# Patient Record
Sex: Female | Born: 1965 | Race: Black or African American | Hispanic: No | Marital: Single | Smoking: Never smoker
Health system: Southern US, Community
[De-identification: ages and names within clinical notes are randomized; demographics above are authoritative.]

## PROBLEM LIST (undated history)

## (undated) DIAGNOSIS — M549 Dorsalgia, unspecified: Secondary | ICD-10-CM

## (undated) DIAGNOSIS — M797 Fibromyalgia: Secondary | ICD-10-CM

## (undated) DIAGNOSIS — D869 Sarcoidosis, unspecified: Secondary | ICD-10-CM

## (undated) HISTORY — PX: APPENDECTOMY: SHX54

## (undated) HISTORY — PX: CHOLECYSTECTOMY: SHX55

## (undated) HISTORY — PX: ABDOMINAL HYSTERECTOMY: SHX81

---

## 2001-02-07 ENCOUNTER — Encounter: Payer: Self-pay | Admitting: Emergency Medicine

## 2001-02-07 ENCOUNTER — Emergency Department (HOSPITAL_COMMUNITY): Admission: EM | Admit: 2001-02-07 | Discharge: 2001-02-08 | Payer: Self-pay | Admitting: Emergency Medicine

## 2001-09-18 ENCOUNTER — Encounter: Payer: Self-pay | Admitting: Emergency Medicine

## 2001-09-19 ENCOUNTER — Encounter: Payer: Self-pay | Admitting: *Deleted

## 2001-09-19 ENCOUNTER — Encounter: Payer: Self-pay | Admitting: Emergency Medicine

## 2001-09-19 ENCOUNTER — Inpatient Hospital Stay (HOSPITAL_COMMUNITY): Admission: EM | Admit: 2001-09-19 | Discharge: 2001-09-23 | Payer: Self-pay | Admitting: Family Medicine

## 2001-09-22 ENCOUNTER — Encounter: Payer: Self-pay | Admitting: Internal Medicine

## 2002-03-12 ENCOUNTER — Encounter: Payer: Self-pay | Admitting: Emergency Medicine

## 2002-03-12 ENCOUNTER — Emergency Department (HOSPITAL_COMMUNITY): Admission: EM | Admit: 2002-03-12 | Discharge: 2002-03-12 | Payer: Self-pay | Admitting: Emergency Medicine

## 2007-03-22 ENCOUNTER — Emergency Department (HOSPITAL_COMMUNITY): Admission: EM | Admit: 2007-03-22 | Discharge: 2007-03-23 | Payer: Self-pay | Admitting: Emergency Medicine

## 2008-07-12 ENCOUNTER — Emergency Department (HOSPITAL_COMMUNITY): Admission: EM | Admit: 2008-07-12 | Discharge: 2008-07-12 | Payer: Self-pay | Admitting: Emergency Medicine

## 2008-08-13 ENCOUNTER — Emergency Department (HOSPITAL_COMMUNITY): Admission: EM | Admit: 2008-08-13 | Discharge: 2008-08-13 | Payer: Self-pay | Admitting: Emergency Medicine

## 2009-02-18 ENCOUNTER — Encounter: Admission: RE | Admit: 2009-02-18 | Discharge: 2009-02-18 | Payer: Self-pay | Admitting: Occupational Medicine

## 2009-04-28 ENCOUNTER — Emergency Department (HOSPITAL_COMMUNITY): Admission: EM | Admit: 2009-04-28 | Discharge: 2009-04-28 | Payer: Self-pay | Admitting: Emergency Medicine

## 2009-06-01 ENCOUNTER — Emergency Department (HOSPITAL_COMMUNITY): Admission: EM | Admit: 2009-06-01 | Discharge: 2009-06-01 | Payer: Self-pay | Admitting: Emergency Medicine

## 2009-08-25 ENCOUNTER — Emergency Department (HOSPITAL_COMMUNITY): Admission: EM | Admit: 2009-08-25 | Discharge: 2009-08-25 | Payer: Self-pay | Admitting: Emergency Medicine

## 2010-02-13 ENCOUNTER — Emergency Department (HOSPITAL_COMMUNITY): Admission: EM | Admit: 2010-02-13 | Discharge: 2010-02-14 | Payer: Self-pay | Admitting: Emergency Medicine

## 2010-03-07 ENCOUNTER — Emergency Department (HOSPITAL_COMMUNITY): Admission: EM | Admit: 2010-03-07 | Discharge: 2010-03-07 | Payer: Self-pay | Admitting: Emergency Medicine

## 2011-01-06 ENCOUNTER — Emergency Department (HOSPITAL_BASED_OUTPATIENT_CLINIC_OR_DEPARTMENT_OTHER)
Admission: EM | Admit: 2011-01-06 | Discharge: 2011-01-06 | Disposition: A | Discharge status: Home / Self Care | Payer: Self-pay | Attending: Emergency Medicine | Admitting: Emergency Medicine

## 2011-01-06 DIAGNOSIS — E876 Hypokalemia: Secondary | ICD-10-CM | POA: Insufficient documentation

## 2011-01-06 DIAGNOSIS — R209 Unspecified disturbances of skin sensation: Secondary | ICD-10-CM | POA: Insufficient documentation

## 2011-01-06 LAB — BASIC METABOLIC PANEL
CO2: 26 mEq/L (ref 19–32)
Calcium: 9.3 mg/dL (ref 8.4–10.5)
Chloride: 108 mEq/L (ref 96–112)
Creatinine, Ser: 0.9 mg/dL (ref 0.4–1.2)
Glucose, Bld: 113 mg/dL — ABNORMAL HIGH (ref 70–99)

## 2011-02-16 LAB — URINE CULTURE

## 2011-02-16 LAB — URINALYSIS, ROUTINE W REFLEX MICROSCOPIC
Bilirubin Urine: NEGATIVE
Specific Gravity, Urine: 1.009 (ref 1.005–1.030)
Urobilinogen, UA: 0.2 mg/dL (ref 0.0–1.0)

## 2011-02-16 LAB — PREGNANCY, URINE: Preg Test, Ur: NEGATIVE

## 2011-02-21 LAB — DIFFERENTIAL
Basophils Absolute: 0 10*3/uL (ref 0.0–0.1)
Basophils Relative: 0 % (ref 0–1)
Eosinophils Absolute: 0.1 10*3/uL (ref 0.0–0.7)
Eosinophils Relative: 1 % (ref 0–5)
Lymphocytes Relative: 16 % (ref 12–46)
Lymphs Abs: 1.6 10*3/uL (ref 0.7–4.0)
Monocytes Absolute: 0.5 10*3/uL (ref 0.1–1.0)
Monocytes Relative: 5 % (ref 3–12)
Neutro Abs: 7.9 10*3/uL — ABNORMAL HIGH (ref 1.7–7.7)
Neutrophils Relative %: 78 % — ABNORMAL HIGH (ref 43–77)

## 2011-02-21 LAB — URINALYSIS, ROUTINE W REFLEX MICROSCOPIC
Bilirubin Urine: NEGATIVE
Glucose, UA: NEGATIVE mg/dL
Leukocytes, UA: NEGATIVE
Protein, ur: NEGATIVE mg/dL
Specific Gravity, Urine: 1.022 (ref 1.005–1.030)
pH: 6 (ref 5.0–8.0)

## 2011-02-21 LAB — CBC
HCT: 41.2 % (ref 36.0–46.0)
Hemoglobin: 13.8 g/dL (ref 12.0–15.0)
MCHC: 33.6 g/dL (ref 30.0–36.0)
MCV: 90.7 fL (ref 78.0–100.0)
Platelets: 348 10*3/uL (ref 150–400)
RBC: 4.55 MIL/uL (ref 3.87–5.11)
RDW: 12.6 % (ref 11.5–15.5)
WBC: 10.2 10*3/uL (ref 4.0–10.5)

## 2011-02-21 LAB — POCT I-STAT, CHEM 8: Sodium: 140 mEq/L (ref 135–145)

## 2011-02-21 LAB — URINE MICROSCOPIC-ADD ON

## 2011-03-04 LAB — URINALYSIS, ROUTINE W REFLEX MICROSCOPIC
Bilirubin Urine: NEGATIVE
Glucose, UA: NEGATIVE mg/dL
Ketones, ur: NEGATIVE mg/dL
Specific Gravity, Urine: 1.02 (ref 1.005–1.030)
Urobilinogen, UA: 0.2 mg/dL (ref 0.0–1.0)

## 2011-03-04 LAB — URINE CULTURE

## 2011-03-04 LAB — URINE MICROSCOPIC-ADD ON

## 2011-03-06 LAB — URINALYSIS, ROUTINE W REFLEX MICROSCOPIC
Hgb urine dipstick: NEGATIVE
Ketones, ur: NEGATIVE mg/dL
Protein, ur: NEGATIVE mg/dL
pH: 6 (ref 5.0–8.0)

## 2011-03-07 LAB — POCT CARDIAC MARKERS
CKMB, poc: <1 ng/mL — ABNORMAL LOW (ref 1.0–8.0)
Troponin i, poc: <0.05 ng/mL (ref 0.00–0.09)

## 2011-03-07 LAB — HEPATIC FUNCTION PANEL
Bilirubin, Direct: 0.2 mg/dL (ref 0.0–0.3)
Total Protein: 7 g/dL (ref 6.0–8.3)

## 2011-03-07 LAB — POCT I-STAT, CHEM 8
Calcium, Ion: 1.12 mmol/L (ref 1.12–1.32)
Creatinine, Ser: 1.1 mg/dL (ref 0.4–1.2)
Hemoglobin: 13.3 g/dL (ref 12.0–15.0)
Potassium: 3.8 mEq/L (ref 3.5–5.1)
TCO2: 22 mmol/L (ref 0–100)

## 2011-03-28 ENCOUNTER — Emergency Department (HOSPITAL_BASED_OUTPATIENT_CLINIC_OR_DEPARTMENT_OTHER)
Admission: EM | Admit: 2011-03-28 | Discharge: 2011-03-28 | Disposition: A | Discharge status: Home / Self Care | Payer: BC Managed Care – PPO | Attending: Emergency Medicine | Admitting: Emergency Medicine

## 2011-03-28 DIAGNOSIS — G8929 Other chronic pain: Secondary | ICD-10-CM | POA: Insufficient documentation

## 2011-03-28 DIAGNOSIS — I10 Essential (primary) hypertension: Secondary | ICD-10-CM | POA: Insufficient documentation

## 2011-04-15 NOTE — Discharge Summary (Signed)
[facility]  Patient:    [name], Lauren Choi Visit Number: [id number] MRN: [id number]          Service Type: MED Location: 3W 0351 01 Attending Physician:  Olene Craven Dictated by:   Kern Reap, M.D. Admit Date:  09/18/2001 Discharge Date: 09/23/2001                             Discharge Summary  DISCHARGE DIAGNOSES: 1. Pyelonephritis. 2. Escherichia coli. 3. Anemia. 4. Fever. 5. Hypokalemia.  DISCHARGE MEDICATIONS: 1. Tequin 400 mg p.o. q.d. x 10 days. 2. Ibuprofen 800 mg t.i.d. x 1 week. 3. Percocet 5/325 one or two tabs p.o. q.6h. #30.  PROCEDURES:  None.  CONSULTATIONS:  None.  HOSPITAL COURSE:  The patient was admitted on 09/19/01, after a several day history of dysuria, frequency, suprapubic tenderness with a temperature up to 103 degrees, complaining of severe lower abdominal and back pain.  She was admitted.  CAT scan of the abdomen, ultrasound of the pelvis, and vaginal ultrasound were unremarkable.  She did have urine consistent with an urinary tract infection, and she was admitted for pyelonephritis.  Blood cultures were negative.  Urine culture was positive for E. coli, suseptible to fluoroquinolone.  She defervesced over the course of her hospitalization.  Her abdominal pain was slow to resolve secondary to this.  A repeat CT scan was performed to rule out a perinephric abscess.  CT was negative except for slight inflammation, likely cause of her continued back pain.  She was discharged on 10/27, in stable condition, afebrile, with improving pain.  She was to finish a full 14 day course of Tequin.  She was to follow up as an outpatient for outpatient workup of anemia with Dr. Barbee Shropshire in one to two weeks.  CONDITION ON DISCHARGE:  Stable.Dictated by:   Kern Reap, M.D.  Attending Physician:  Olene Craven DD:  10/17/01 TD:  10/17/01 Job: [id number]

## 2011-04-15 NOTE — H&P (Signed)
[facility]  Patient:    Lauren Choi, Lauren Choi Visit Number: [id number] MRN: [id number]          Service Type: MED Location: 3W 0351 01 Attending Physician:  Olene Craven Dictated by:   Kern Reap, M.D. Admit Date:  09/18/2001                           History and Physical  CHIEF COMPLAINT:  Fever, abdominal pain.  HISTORY OF PRESENT ILLNESS:  [age]-year-old female started her menstrual period on Friday, September 16, 2001.  Over the weekend developed suprapubic tenderness, dysuria, frequency, which further progressed to abdominal pain, back pain, and fevers, the highest 103 as of Tuesday, September 18, 2001.  She came to the hospital for further evaluation.  REVIEW OF SYSTEMS:  Positive for fevers and chills.  Positive for lower abdominal pain, low back pain, dysuria, frequency, hematuria.  Negative for chest pain, negative for shortness of breath.  No rashes noted.  No neurological changes.  No melena.  No musculoskeletal complaints.  Ten-plus symptoms reviewed.  See ER sheet.  PAST MEDICAL HISTORY:  Denies.  PAST SURGICAL HISTORY:  Tubal ligation in 1990, with scar tissue removal in 1999.  SOCIAL HISTORY:  Single, three children.  Nonsmoker, nondrinker.  Is an Higher education careers adviser.  FAMILY HISTORY:  Noncontributory.  ALLERGIES:  CODEINE causes itch.  MEDICATIONS:  She currently takes no medications.  PHYSICAL EXAMINATION:  VITAL SIGNS:  Temperature 103.4, blood pressure 154/98, pulse 154, temperature respiratory rate 22.  GENERAL:  Awake and oriented female in mild distress.  HEENT:  Pupils equal and reactive to light.  Extraocular muscles intact.  NECK:  Supple.  No JVD, no bruit, no thyromegaly, no lymphadenopathy. Tympanic membranes within normal.  Fundus within normal.  LUNGS:  Clear to auscultation.  HEART:  Tachycardic, S1, S2.  Without murmur, gallop, or rub.  ABDOMEN:  Soft, nondistended.  Positive bowel sounds.   Left lower quadrant and right lower quadrant abdominal discomfort.  No guarding, no rigidity. Suprapubic tenderness.  Positive CVA tenderness.  PELVIC:  Per EDP.  Cultures were sent.  EXTREMITIES:  No clubbing or cyanosis.  NEUROLOGIC:  Nonfocal.  SKIN:  Cool, dry, no rashes noted.  LABORATORY DATA:  Chest x-ray was negative.  Abdominal film:  No free air. Showed thick-looped bowel.  CT of the was no significant abnormalities, a 4 cm left renal cyst was noted.  CT of the pelvis showed a 1.5 cm right ovarian cyst, no free air, no other abnormalities.  Ultrasound of the pelvis was no significant abnormalities.  Transvaginal ultrasound showed a small right ovarian cyst, no free fluid, no free air.  Urine pregnancy was negative.  WBC 10.5, hemoglobin 11.8, platelets 337, neutrophils 84%.  Sodium 137, potassium 3.2, chloride 107, bicarbonate 22, BUN 11, creatinine 1.2, glucose 127, total bilirubin ______ , albumin 3.9, AST 20, ALT 20, alkaline phosphatase 66.  UA was positive for blood, nitrate, leukocyte esterase, and 7-10 wbcs.  IMPRESSION AND PLAN:  Ms. Rathod presents with probable pyelonephritis, rule out sepsis.  Started with dysuria and frequency and progressed to abdominal pain, back pain, and fevers.  She has had extensive workup in the emergency room including CT of the abdomen, pelvis, and ultrasound of the abdomen and then pelvis, with only significant findings of right ovarian cyst, probably incidental.  She has received a pelvic examination per the EDP, and cultures have been sent off.  At  this time the significant findings are urine and likely pyelonephritis.  Will admit the patient for further IV hydration, IV Tequin, blood cultures, and urine cultures.  We will monitor abdominal pain. Will also administer some pain control.  She does not appear to have a surgical abdomen at this time.  Dictated by:   Kern Reap, M.D.  Attending Physician:  Olene Craven DD:   09/19/01 TD:  09/20/01 Job: [id number]

## 2011-08-29 LAB — URINE CULTURE: Colony Count: >=100000

## 2011-08-29 LAB — URINALYSIS, ROUTINE W REFLEX MICROSCOPIC
Bilirubin Urine: NEGATIVE
Nitrite: NEGATIVE
Specific Gravity, Urine: 1.015
Urobilinogen, UA: 0.2

## 2011-08-29 LAB — URINE MICROSCOPIC-ADD ON

## 2013-06-06 ENCOUNTER — Emergency Department (HOSPITAL_COMMUNITY): Payer: Self-pay

## 2013-06-06 ENCOUNTER — Encounter (HOSPITAL_COMMUNITY): Payer: Self-pay | Admitting: Emergency Medicine

## 2013-06-06 ENCOUNTER — Emergency Department (HOSPITAL_COMMUNITY)
Admission: EM | Admit: 2013-06-06 | Discharge: 2013-06-06 | Disposition: A | Discharge status: Home / Self Care | Payer: Self-pay | Attending: Emergency Medicine | Admitting: Emergency Medicine

## 2013-06-06 DIAGNOSIS — Z791 Long term (current) use of non-steroidal anti-inflammatories (NSAID): Secondary | ICD-10-CM | POA: Insufficient documentation

## 2013-06-06 DIAGNOSIS — R079 Chest pain, unspecified: Secondary | ICD-10-CM

## 2013-06-06 DIAGNOSIS — R072 Precordial pain: Secondary | ICD-10-CM | POA: Insufficient documentation

## 2013-06-06 HISTORY — DX: Dorsalgia, unspecified: M54.9

## 2013-06-06 LAB — CBC
HCT: 40.7 % (ref 36.0–46.0)
Hemoglobin: 13.8 g/dL (ref 12.0–15.0)
MCV: 85 fL (ref 78.0–100.0)
Platelets: 326 10*3/uL (ref 150–400)
RBC: 4.79 MIL/uL (ref 3.87–5.11)
WBC: 7.8 10*3/uL (ref 4.0–10.5)

## 2013-06-06 LAB — BASIC METABOLIC PANEL
Calcium: 10 mg/dL (ref 8.4–10.5)
GFR calc Af Amer: 81 mL/min — ABNORMAL LOW (ref >90)
GFR calc non Af Amer: 70 mL/min — ABNORMAL LOW (ref >90)

## 2013-06-06 LAB — POCT I-STAT TROPONIN I: Troponin i, poc: 0.01 ng/mL (ref 0.00–0.08)

## 2013-06-06 MED ORDER — HYDROMORPHONE HCL PF 1 MG/ML IJ SOLN
1.0000 mg | Freq: Once | INTRAMUSCULAR | Status: AC
  Administered 2013-06-06: 1 mg via INTRAVENOUS
  Filled 2013-06-06: qty 1

## 2013-06-06 MED ORDER — IOHEXOL 350 MG/ML SOLN
100.0000 mL | Freq: Once | INTRAVENOUS | Status: AC | PRN
  Administered 2013-06-06: 100 mL via INTRAVENOUS

## 2013-06-06 MED ORDER — PREDNISONE 20 MG PO TABS
60.0000 mg | ORAL_TABLET | ORAL | Status: AC
  Administered 2013-06-06: 60 mg via ORAL
  Filled 2013-06-06: qty 3

## 2013-06-06 MED ORDER — MORPHINE SULFATE 4 MG/ML IJ SOLN: 8.0000 mg | Freq: Once | INTRAMUSCULAR | Status: DC

## 2013-06-06 MED ORDER — MORPHINE SULFATE 4 MG/ML IJ SOLN
8.0000 mg | Freq: Once | INTRAMUSCULAR | Status: AC
  Administered 2013-06-06: 8 mg via INTRAVENOUS
  Filled 2013-06-06: qty 2

## 2013-06-06 MED ORDER — ALBUTEROL SULFATE (5 MG/ML) 0.5% IN NEBU
5.0000 mg | INHALATION_SOLUTION | Freq: Once | RESPIRATORY_TRACT | Status: AC
  Administered 2013-06-06: 5 mg via RESPIRATORY_TRACT
  Filled 2013-06-06: qty 1

## 2013-06-06 MED ORDER — GI COCKTAIL ~~LOC~~
30.0000 mL | Freq: Once | ORAL | Status: AC
  Administered 2013-06-06: 30 mL via ORAL
  Filled 2013-06-06: qty 30

## 2013-06-06 NOTE — ED Notes (Signed)
Pt states chest pain started at 10 today and has  Been constant ever since,  Nausea and radiation,  Shortness of breath  dizziness

## 2013-06-06 NOTE — ED Provider Notes (Signed)
History    CSN: [id number] Arrival date & time 06/06/13  1919  First MD Initiated Contact with Patient 06/06/13 1926     Chief Complaint  Patient presents with  . Chest Pain   (Consider location/radiation/quality/duration/timing/severity/associated sxs/prior Treatment) HPI Patient presents with chest pain. The pain is substernal,  With mild diffuse radiation., sore. There is associated nausea. There is associated dyspnea, and dizziness without syncope or vomiting or diarrhea. Patient notes that she was in her usual state of health prior to the onset of symptoms. She denies fevers, chills, cough. No relief with anything, no clear exacerbating   Past Medical History  Diagnosis Date  . Back pain    Past Surgical History  Procedure Laterality Date  . Cholecystectomy    . Abdominal hysterectomy    . Appendectomy     No family history on file. History  Substance Use Topics  . Smoking status: Never Smoker   . Smokeless tobacco: Not on file  . Alcohol Use: No   OB History   Grav Para Term Preterm Abortions TAB SAB Ect Mult Living                 Review of Systems  Constitutional:       Per HPI, otherwise negative  HENT:       Per HPI, otherwise negative  Respiratory:       Per HPI, otherwise negative  Cardiovascular:       Per HPI, otherwise negative  Gastrointestinal: Negative for vomiting.  Endocrine:       Negative aside from HPI  Genitourinary:       Neg aside from HPI   Musculoskeletal:       Per HPI, otherwise negative  Skin: Negative.   Neurological: Negative for syncope.    Allergies  Tramadol  Home Medications   Current Outpatient Rx  Name  Route  Sig  Dispense  Refill  . cyclobenzaprine (FLEXERIL) 10 MG tablet   Oral   Take 10 mg by mouth 3 (three) times daily as needed for muscle spasms.         . meloxicam (MOBIC) 15 MG tablet   Oral   Take 15 mg by mouth daily.         Marland Kitchen morphine (MSIR) 15 MG tablet   Oral   Take 22.5 mg by  mouth every 4 (four) hours as needed for pain.          BP 108/79  Pulse 119  Temp(Src) 98.6 F (37 C) (Oral)  Resp 18  SpO2 99% Physical Exam  Nursing note and vitals reviewed. Constitutional: She is oriented to person, place, and time. She appears well-developed and well-nourished. No distress.  HENT:  Head: Normocephalic and atraumatic.  Eyes: Conjunctivae and EOM are normal.  Cardiovascular: Regular rhythm.  Tachycardia present.   Pulmonary/Chest: Breath sounds normal. No stridor. Tachypnea noted. No respiratory distress.  Abdominal: She exhibits no distension.  Musculoskeletal: She exhibits no edema.  Neurological: She is alert and oriented to person, place, and time. No cranial nerve deficit.  Skin: Skin is warm and dry.  Psychiatric: She has a normal mood and affect.    ED Course  Procedures (including critical care time) Labs Reviewed  BASIC METABOLIC PANEL - Abnormal; Notable for the following:    Potassium 3.3 (*)    Glucose, Bld 108 (*)    GFR calc non Af Amer 70 (*)    GFR calc Af Amer 81 (*)  All other components within normal limits  CBC  PRO B NATRIURETIC PEPTIDE  POCT I-STAT TROPONIN I   Dg Chest Port 1 View  06/06/2013   *RADIOLOGY REPORT*  Clinical Data: Chest pain.  PORTABLE CHEST - 1 VIEW  Comparison: 03/07/2010  Findings: Heart is upper limits normal in size.  Lungs are clear. No effusions or edema.  No acute bony abnormality.  Mediastinal contours within normal limits.  IMPRESSION: No active cardiopulmonary disease.   Original Report Authenticated By: Charlett Nose, M.D.   No diagnosis found. Cardiac 120 sinus tach abnormal Pulse ox 100% room air normal EKG is 113 sinus tachycardia with nonspecific T wave changes.  It is faster, but otherwise not notably different from prior EKG.  This is an abnormal EKG  9:09 PM Patient mildly better. She now has clear inspiratory sounds - but poor expiratory sounds. She now states that she has  sarcoidosis.  I reviewed the patient's chart from another facility.  She has a prior diagnosis of chronic chest pain, sarcoidosis, CT scan was performed in 1 year ago. MDM  This patient presents with chest pain.  On exam she is awake and alert, but tachycardic, and uncomfortable appearing patient's evaluation was largely reassuring, and skin performed with concern for tachycardia, dyspnea, to rule out PE was reassuring.  The patient was discharged in stable condition to follow up with her primary care team.  Gerhard Munch, MD 06/06/13 2311

## 2013-06-06 NOTE — ED Notes (Signed)
Notified RN, Tobi Bastos heart rate elevated 136 and blood pressure elevated 140/100.

## 2014-03-06 ENCOUNTER — Emergency Department (HOSPITAL_BASED_OUTPATIENT_CLINIC_OR_DEPARTMENT_OTHER)
Admission: EM | Admit: 2014-03-06 | Discharge: 2014-03-07 | Disposition: A | Discharge status: Home / Self Care | Payer: BC Managed Care – PPO | Attending: Emergency Medicine | Admitting: Emergency Medicine

## 2014-03-06 ENCOUNTER — Encounter (HOSPITAL_BASED_OUTPATIENT_CLINIC_OR_DEPARTMENT_OTHER): Payer: Self-pay | Admitting: Emergency Medicine

## 2014-03-06 DIAGNOSIS — G8929 Other chronic pain: Secondary | ICD-10-CM | POA: Insufficient documentation

## 2014-03-06 DIAGNOSIS — Z79899 Other long term (current) drug therapy: Secondary | ICD-10-CM | POA: Insufficient documentation

## 2014-03-06 DIAGNOSIS — Z791 Long term (current) use of non-steroidal anti-inflammatories (NSAID): Secondary | ICD-10-CM | POA: Insufficient documentation

## 2014-03-06 DIAGNOSIS — M545 Low back pain, unspecified: Secondary | ICD-10-CM | POA: Insufficient documentation

## 2014-03-06 DIAGNOSIS — M549 Dorsalgia, unspecified: Secondary | ICD-10-CM

## 2014-03-06 DIAGNOSIS — F411 Generalized anxiety disorder: Secondary | ICD-10-CM | POA: Insufficient documentation

## 2014-03-06 DIAGNOSIS — R52 Pain, unspecified: Secondary | ICD-10-CM | POA: Insufficient documentation

## 2014-03-06 DIAGNOSIS — M6281 Muscle weakness (generalized): Secondary | ICD-10-CM | POA: Insufficient documentation

## 2014-03-06 DIAGNOSIS — R209 Unspecified disturbances of skin sensation: Secondary | ICD-10-CM | POA: Insufficient documentation

## 2014-03-06 DIAGNOSIS — M25559 Pain in unspecified hip: Secondary | ICD-10-CM | POA: Insufficient documentation

## 2014-03-06 NOTE — ED Notes (Signed)
Lower back pain. Hx of bulging disc.

## 2014-03-07 MED ORDER — HYDROMORPHONE HCL PF 2 MG/ML IJ SOLN
INTRAMUSCULAR | Status: AC
  Administered 2014-03-07: 2 mg
  Filled 2014-03-07: qty 1

## 2014-03-07 MED ORDER — MORPHINE SULFATE 4 MG/ML IJ SOLN
6.0000 mg | Freq: Once | INTRAMUSCULAR | Status: DC
  Filled 2014-03-07: qty 2

## 2014-03-07 MED ORDER — HYDROMORPHONE HCL PF 2 MG/ML IJ SOLN
2.0000 mg | Freq: Once | INTRAMUSCULAR | Status: AC
Start: 2014-03-07 — End: 2014-03-07
  Administered 2014-03-07: 2 mg via INTRAVENOUS
  Filled 2014-03-07: qty 1

## 2014-03-07 MED ORDER — MORPHINE SULFATE 4 MG/ML IJ SOLN
4.0000 mg | Freq: Once | INTRAMUSCULAR | Status: AC
  Administered 2014-03-07: 4 mg via INTRAVENOUS

## 2014-03-07 MED ORDER — ONDANSETRON HCL 4 MG/2ML IJ SOLN
4.0000 mg | Freq: Once | INTRAMUSCULAR | Status: AC
  Administered 2014-03-07: 4 mg via INTRAVENOUS
  Filled 2014-03-07: qty 2

## 2014-03-07 NOTE — ED Provider Notes (Signed)
CSN: [id number]     Arrival date & time 03/06/14  2210 History   First MD Initiated Contact with Patient 03/07/14 0104     Chief Complaint  Patient presents with  . Back Pain     (Consider location/radiation/quality/duration/timing/severity/associated sxs/prior Treatment) HPI This is a 48 year old female with a history of chronic back pain on chronic Nucynta 75 mg tablets managed by [facility]. Her most recent prescription was for 150 tablets filled on April 6. She is here with an acute exacerbation of her pain that began 3 days ago. The pain is located across her lower back and radiates down her right leg. Her pain is severe and not controlled with her scheduled narcotics. It is very difficult for her to ambulate due to the pain and she has to hold onto objects to keep from falling. She states her right leg is weaker than usual with increased paresthesias. She denies bowel or bladder changes. She has not contacted her pain management clinic. She states she has had to visit various emergency departments in the area when she has had exacerbations of her pain. She acknowledges that her pain is never completely controlled in an ED setting.  Past Medical History  Diagnosis Date  . Back pain    Past Surgical History  Procedure Laterality Date  . Cholecystectomy    . Abdominal hysterectomy    . Appendectomy     No family history on file. History  Substance Use Topics  . Smoking status: Never Smoker   . Smokeless tobacco: Not on file  . Alcohol Use: No   OB History   Grav Para Term Preterm Abortions TAB SAB Ect Mult Living                 Review of Systems  All other systems reviewed and are negative.  Allergies  Tramadol  Home Medications   Current Outpatient Rx  Name  Route  Sig  Dispense  Refill  . diazepam (VALIUM) 5 MG tablet   Oral   Take 5 mg by mouth every 6 (six) hours as needed for anxiety.         . sertraline (ZOLOFT) 50 MG tablet   Oral   Take  50 mg by mouth daily.         . tapentadol (NUCYNTA) 50 MG TABS tablet   Oral   Take 75 mg by mouth.         . cyclobenzaprine (FLEXERIL) 10 MG tablet   Oral   Take 10 mg by mouth 3 (three) times daily as needed for muscle spasms.         . meloxicam (MOBIC) 15 MG tablet   Oral   Take 15 mg by mouth daily.         Marland Kitchen morphine (MSIR) 15 MG tablet   Oral   Take 22.5 mg by mouth every 4 (four) hours as needed for pain.          BP 114/78  Pulse 93  Temp(Src) 98.2 F (36.8 C) (Oral)  Resp 20  Ht 5\' 7"  (1.702 m)  Wt 189 lb (85.73 kg)  BMI 29.59 kg/m2  SpO2 99%  Physical Exam General: Well-developed, well-nourished female in no acute distress; appearance consistent with age of record; appears uncomfortable HENT: normocephalic; atraumatic Eyes: pupils equal, round and reactive to light; extraocular muscles intact Neck: supple Heart: regular rate and rhythm Lungs: clear to auscultation bilaterally Abdomen: soft; nondistended; nontender; no masses or hepatosplenomegaly;  bowel sounds present GU: no saddle anesthesia Back: Tenderness across lower back; severe pain on movement of lower back or legs at the hip Extremities: No deformity; decreased range of motion at the hip Neurologic: Awake, alert and oriented; motor function +5 in upper extremities, + left lower extremity, +4 in right lower extremity; decreased sensation right lower extremity; no facial droop Skin: Warm and dry Psychiatric: Anxious    ED Course  Procedures (including critical care time)   MDM  5:47 AM Patient states her pain is down to 7/10. The narcotics have made her groggy and she was advised that we do not feel that it is safe to give her any additional narcotics. She declined Toradol. She was advised that she needs to contact her pain management clinic this morning this emergency departments or not an appropriate long-term solution to chronic pain.     Hanley SeamenJohn L Donnisha Besecker, MD 03/07/14 [id number]

## 2014-08-01 ENCOUNTER — Encounter (HOSPITAL_COMMUNITY): Payer: Self-pay | Admitting: Emergency Medicine

## 2014-08-01 ENCOUNTER — Emergency Department (HOSPITAL_COMMUNITY)
Admission: EM | Admit: 2014-08-01 | Discharge: 2014-08-02 | Disposition: A | Discharge status: Home / Self Care | Payer: Self-pay | Attending: Emergency Medicine | Admitting: Emergency Medicine

## 2014-08-01 ENCOUNTER — Emergency Department (HOSPITAL_COMMUNITY): Payer: Self-pay

## 2014-08-01 DIAGNOSIS — M545 Low back pain, unspecified: Secondary | ICD-10-CM | POA: Insufficient documentation

## 2014-08-01 DIAGNOSIS — Z791 Long term (current) use of non-steroidal anti-inflammatories (NSAID): Secondary | ICD-10-CM | POA: Insufficient documentation

## 2014-08-01 DIAGNOSIS — R339 Retention of urine, unspecified: Secondary | ICD-10-CM | POA: Insufficient documentation

## 2014-08-01 DIAGNOSIS — Z79899 Other long term (current) drug therapy: Secondary | ICD-10-CM | POA: Insufficient documentation

## 2014-08-01 MED ORDER — HYDROMORPHONE HCL PF 1 MG/ML IJ SOLN
1.0000 mg | Freq: Once | INTRAMUSCULAR | Status: AC
  Administered 2014-08-01: 1 mg via INTRAMUSCULAR
  Filled 2014-08-01: qty 1

## 2014-08-01 MED ORDER — ONDANSETRON 4 MG PO TBDP
4.0000 mg | ORAL_TABLET | Freq: Once | ORAL | Status: AC
  Administered 2014-08-01: 4 mg via ORAL
  Filled 2014-08-01: qty 1

## 2014-08-01 NOTE — ED Notes (Signed)
Report from [place], [place]

## 2014-08-01 NOTE — ED Notes (Signed)
Pt states she cannot void; MD made aware

## 2014-08-01 NOTE — ED Provider Notes (Signed)
CSN: [id number]     Arrival date & time 08/01/14  1805 History   First MD Initiated Contact with Patient 08/01/14 2138     Chief Complaint  Patient presents with  . Back Pain  . Urinary Retention     (Consider location/radiation/quality/duration/timing/severity/associated sxs/prior Treatment) HPI Comments: Patient reporting inability to urinate since around 1100 today, even though she feels the urge to do so.  Patient is a 48 y.o. female presenting with back pain and dysuria. The history is provided by the patient.  Back Pain Location:  Lumbar spine Quality:  Aching Radiates to: groin. Pain severity:  Moderate Pain is:  Same all the time Timing:  Intermittent Progression:  Worsening Chronicity:  Chronic Associated symptoms: dysuria   Associated symptoms: no abdominal pain   Dysuria Pain severity:  Mild Duration:  1 day Chronicity:  New Recent urinary tract infections: no   Associated symptoms: no abdominal pain, no flank pain, no nausea and no vomiting     Past Medical History  Diagnosis Date  . Back pain    Past Surgical History  Procedure Laterality Date  . Cholecystectomy    . Abdominal hysterectomy    . Appendectomy     History reviewed. No pertinent family history. History  Substance Use Topics  . Smoking status: Never Smoker   . Smokeless tobacco: Not on file  . Alcohol Use: No   OB History   Grav Para Term Preterm Abortions TAB SAB Ect Mult Living                 Review of Systems  Gastrointestinal: Negative for nausea, vomiting, abdominal pain and abdominal distention.  Genitourinary: Positive for dysuria and difficulty urinating. Negative for flank pain.  Musculoskeletal: Positive for back pain.  All other systems reviewed and are negative.     Allergies  Tramadol  Home Medications   Prior to Admission medications   Medication Sig Start Date End Date Taking? Authorizing Provider  cyclobenzaprine (FLEXERIL) 10 MG tablet Take 10 mg by mouth  3 (three) times daily as needed for muscle spasms.    Historical Provider, MD  diazepam (VALIUM) 5 MG tablet Take 5 mg by mouth every 6 (six) hours as needed for anxiety.    Historical Provider, MD  meloxicam (MOBIC) 15 MG tablet Take 15 mg by mouth daily.    Historical Provider, MD  morphine (MSIR) 15 MG tablet Take 22.5 mg by mouth every 4 (four) hours as needed for pain.    Historical Provider, MD  sertraline (ZOLOFT) 50 MG tablet Take 50 mg by mouth daily.    Historical Provider, MD  tapentadol (NUCYNTA) 50 MG TABS tablet Take 75 mg by mouth.    Historical Provider, MD   BP 112/72  Pulse 105  Temp(Src) 98.6 F (37 C) (Oral)  Resp 18  SpO2 98% Physical Exam  Nursing note and vitals reviewed. Constitutional: She is oriented to person, place, and time. She appears well-developed and well-nourished.  HENT:  Head: Normocephalic and atraumatic.  Eyes: Pupils are equal, round, and reactive to light.  Neck: Normal range of motion.  Cardiovascular: Normal rate and regular rhythm.   Pulmonary/Chest: Breath sounds normal.  Abdominal: Soft. Bowel sounds are normal.  Musculoskeletal: Normal range of motion.  Lymphadenopathy:    She has no cervical adenopathy.  Neurological: She is alert and oriented to person, place, and time. No cranial nerve deficit. Coordination normal.  Skin: Skin is warm and dry.    ED Course  Procedures (including critical care time) Labs Review Labs Reviewed - No data to display  Imaging Review No results found. Bladder scan reveals ~350 cc urine.  Patient unable to void.  MOE x 4, no strength deficits noted. No problem with ambulation. Discussed with Dr. Sherlyn Lees obtain MRI.  MRI results reviewed and shared with patient.   MDM   Final diagnoses:  None    Urinary retention.  MRI not suggestive of neurologic etiology.  No UTI. Foley catheter placed, will have patient follow-up with urology.    Jimmye Norman, NP 08/02/14 [phone]

## 2014-08-01 NOTE — ED Notes (Signed)
Pt presents with chronic back pain x1-2 years, pt reports numbness to BUE and BLE x2 days, pt is ambulatory to triage, performing all ADL's. Pt states she is unable to "urinate" all day

## 2014-08-02 LAB — CBC WITH DIFFERENTIAL/PLATELET
BASOS PCT: 0 % (ref 0–1)
Basophils Absolute: 0 10*3/uL (ref 0.0–0.1)
EOS ABS: 0.2 10*3/uL (ref 0.0–0.7)
Eosinophils Relative: 3 % (ref 0–5)
HCT: 37.9 % (ref 36.0–46.0)
Hemoglobin: 12.5 g/dL (ref 12.0–15.0)
LYMPHS PCT: 40 % (ref 12–46)
Lymphs Abs: 2.4 10*3/uL (ref 0.7–4.0)
MCH: 28.9 pg (ref 26.0–34.0)
MCHC: 33 g/dL (ref 30.0–36.0)
MCV: 87.5 fL (ref 78.0–100.0)
Monocytes Absolute: 0.3 10*3/uL (ref 0.1–1.0)
Monocytes Relative: 5 % (ref 3–12)
NEUTROS ABS: 3 10*3/uL (ref 1.7–7.7)
Neutrophils Relative %: 52 % (ref 43–77)
Platelets: 254 10*3/uL (ref 150–400)
RBC: 4.33 MIL/uL (ref 3.87–5.11)
RDW: 12 % (ref 11.5–15.5)
WBC: 5.8 10*3/uL (ref 4.0–10.5)

## 2014-08-02 LAB — URINALYSIS, ROUTINE W REFLEX MICROSCOPIC
Bilirubin Urine: NEGATIVE
GLUCOSE, UA: NEGATIVE mg/dL
Hgb urine dipstick: NEGATIVE
Ketones, ur: NEGATIVE mg/dL
LEUKOCYTES UA: NEGATIVE
Nitrite: NEGATIVE
PROTEIN: NEGATIVE mg/dL
Specific Gravity, Urine: 1.019 (ref 1.005–1.030)
Urobilinogen, UA: 0.2 mg/dL (ref 0.0–1.0)
pH: 5.5 (ref 5.0–8.0)

## 2014-08-02 LAB — BASIC METABOLIC PANEL
Anion gap: 12 (ref 5–15)
BUN: 13 mg/dL (ref 6–23)
CALCIUM: 8.9 mg/dL (ref 8.4–10.5)
CO2: 24 mEq/L (ref 19–32)
Chloride: 105 mEq/L (ref 96–112)
Creatinine, Ser: 1.12 mg/dL — ABNORMAL HIGH (ref 0.50–1.10)
GFR, EST AFRICAN AMERICAN: 66 mL/min — AB (ref >90)
GFR, EST NON AFRICAN AMERICAN: 57 mL/min — AB (ref >90)
Glucose, Bld: 99 mg/dL (ref 70–99)
Potassium: 3.7 mEq/L (ref 3.7–5.3)
SODIUM: 141 meq/L (ref 137–147)

## 2014-08-02 NOTE — ED Notes (Signed)
Pt foley changed to leg bag.  Education provided to pt regarding how to empty leg bag and that foley will stay inserted until she f/u with urology in 4 days.  Pt verbalized understanding of education regarding leg bag.

## 2014-08-02 NOTE — ED Notes (Signed)
Pt given sprite, peanut butter, and crackers.

## 2014-08-02 NOTE — ED Provider Notes (Signed)
Medical screening examination/treatment/procedure(s) were performed by non-physician practitioner and as supervising physician I was immediately available for consultation/collaboration.   EKG Interpretation None        Glorianne Proctor T Joniyah Mallinger, MD 08/02/14 1515 

## 2014-08-02 NOTE — Discharge Instructions (Signed)
Acute Urinary Retention °Acute urinary retention is the temporary inability to urinate. This is an uncommon problem in women. It can be caused by: °· Infection. °· A side effect of a medicine. °· A problem in a nearby organ that presses or squeezes on the bladder or the urethra (the tube that drains the bladder). °· Psychological problems. °·  Surgery on your bladder, urethra, or pelvic organs that causes obstruction to the outflow of urine from your bladder. °HOME CARE INSTRUCTIONS  °If you are sent home with a Foley catheter and a drainage system, you will need to discuss the best course of action with your health care provider. While the catheter is in, maintain a good intake of fluids. Keep the drainage bag emptied and lower than your catheter. This is so that contaminated urine will not flow back into your bladder, which could lead to a urinary tract infection. °There are two main types of drainage bags. One is a large bag that usually is used at night. It has a good capacity that will allow you to sleep through the night without having to empty it. The second type is called a leg bag. It has a smaller capacity so it needs to be emptied more frequently. However, the main advantage is that it can be attached by a leg strap and goes underneath your clothing, allowing you the freedom to move about or leave your home. °Only take over-the-counter or prescription medicines for pain, discomfort, or fever as directed by your health care provider.  °SEEK MEDICAL CARE IF: °· You develop a low-grade fever. °· You experience spasms or leakage of urine with the spasms. °SEEK IMMEDIATE MEDICAL CARE IF:  °· You develop chills or fever. °· Your catheter stops draining urine. °· Your catheter falls out. °· You start to develop increased bleeding that does not respond to rest and increased fluid intake. °MAKE SURE YOU: °· Understand these instructions. °· Will watch your condition. °· Will get help right away if you are not  doing well or get worse. °Document Released: 11/13/2006 Document Revised: 09/04/2013 Document Reviewed: 04/25/2013 °[employer]® Patient Information ©2015 [employer]. This information is not intended to replace advice given to you by your health care provider. Make sure you discuss any questions you have with your health care provider. ° °

## 2015-01-21 ENCOUNTER — Emergency Department (HOSPITAL_COMMUNITY): Payer: Self-pay

## 2015-01-21 ENCOUNTER — Emergency Department (HOSPITAL_COMMUNITY)
Admission: EM | Admit: 2015-01-21 | Discharge: 2015-01-21 | Disposition: A | Discharge status: Home / Self Care | Payer: Self-pay | Attending: Emergency Medicine | Admitting: Emergency Medicine

## 2015-01-21 ENCOUNTER — Encounter (HOSPITAL_COMMUNITY): Payer: Self-pay | Admitting: *Deleted

## 2015-01-21 DIAGNOSIS — R0602 Shortness of breath: Secondary | ICD-10-CM | POA: Insufficient documentation

## 2015-01-21 DIAGNOSIS — Z791 Long term (current) use of non-steroidal anti-inflammatories (NSAID): Secondary | ICD-10-CM | POA: Insufficient documentation

## 2015-01-21 DIAGNOSIS — Z862 Personal history of diseases of the blood and blood-forming organs and certain disorders involving the immune mechanism: Secondary | ICD-10-CM | POA: Insufficient documentation

## 2015-01-21 DIAGNOSIS — Z79899 Other long term (current) drug therapy: Secondary | ICD-10-CM | POA: Insufficient documentation

## 2015-01-21 DIAGNOSIS — M546 Pain in thoracic spine: Secondary | ICD-10-CM | POA: Insufficient documentation

## 2015-01-21 HISTORY — DX: Sarcoidosis, unspecified: D86.9

## 2015-01-21 LAB — CBC WITH DIFFERENTIAL/PLATELET
Basophils Absolute: 0 10*3/uL (ref 0.0–0.1)
Basophils Relative: 0 % (ref 0–1)
Eosinophils Absolute: 0.2 10*3/uL (ref 0.0–0.7)
Eosinophils Relative: 2 % (ref 0–5)
HCT: 41.3 % (ref 36.0–46.0)
HEMOGLOBIN: 14 g/dL (ref 12.0–15.0)
LYMPHS ABS: 2.6 10*3/uL (ref 0.7–4.0)
Lymphocytes Relative: 35 % (ref 12–46)
MCH: 29.4 pg (ref 26.0–34.0)
MCHC: 33.9 g/dL (ref 30.0–36.0)
MCV: 86.6 fL (ref 78.0–100.0)
MONOS PCT: 5 % (ref 3–12)
Monocytes Absolute: 0.4 10*3/uL (ref 0.1–1.0)
NEUTROS ABS: 4.2 10*3/uL (ref 1.7–7.7)
Neutrophils Relative %: 58 % (ref 43–77)
Platelets: 339 10*3/uL (ref 150–400)
RBC: 4.77 MIL/uL (ref 3.87–5.11)
RDW: 12.3 % (ref 11.5–15.5)
WBC: 7.4 10*3/uL (ref 4.0–10.5)

## 2015-01-21 LAB — TROPONIN I: Troponin I: <0.03 ng/mL (ref <0.031)

## 2015-01-21 LAB — BASIC METABOLIC PANEL
Anion gap: 11 (ref 5–15)
BUN: 6 mg/dL (ref 6–23)
CHLORIDE: 106 mmol/L (ref 96–112)
CO2: 22 mmol/L (ref 19–32)
Calcium: 9.6 mg/dL (ref 8.4–10.5)
Creatinine, Ser: 0.95 mg/dL (ref 0.50–1.10)
GFR calc Af Amer: 80 mL/min — ABNORMAL LOW (ref >90)
GFR calc non Af Amer: 69 mL/min — ABNORMAL LOW (ref >90)
Glucose, Bld: 144 mg/dL — ABNORMAL HIGH (ref 70–99)
POTASSIUM: 3.3 mmol/L — AB (ref 3.5–5.1)
SODIUM: 139 mmol/L (ref 135–145)

## 2015-01-21 MED ORDER — ONDANSETRON HCL 4 MG/2ML IJ SOLN
4.0000 mg | Freq: Once | INTRAMUSCULAR | Status: AC
  Administered 2015-01-21: 4 mg via INTRAVENOUS
  Filled 2015-01-21: qty 2

## 2015-01-21 MED ORDER — IOHEXOL 350 MG/ML SOLN
100.0000 mL | Freq: Once | INTRAVENOUS | Status: AC | PRN
Start: 2015-01-21 — End: 2015-01-21
  Administered 2015-01-21: 80 mL via INTRAVENOUS

## 2015-01-21 MED ORDER — METHOCARBAMOL 1000 MG/10ML IJ SOLN
1000.0000 mg | Freq: Once | INTRAVENOUS | Status: AC
  Administered 2015-01-21: 1000 mg via INTRAVENOUS
  Filled 2015-01-21: qty 10

## 2015-01-21 MED ORDER — OXYCODONE-ACETAMINOPHEN 5-325 MG PO TABS: 2.0000 | ORAL_TABLET | ORAL | Status: DC | PRN

## 2015-01-21 MED ORDER — METHOCARBAMOL 500 MG PO TABS: 500.0000 mg | ORAL_TABLET | Freq: Two times a day (BID) | ORAL | Status: DC

## 2015-01-21 MED ORDER — NAPROXEN 500 MG PO TABS: 500.0000 mg | ORAL_TABLET | Freq: Two times a day (BID) | ORAL | Status: DC

## 2015-01-21 MED ORDER — HYDROMORPHONE HCL 1 MG/ML IJ SOLN
1.0000 mg | INTRAMUSCULAR | Status: AC | PRN
  Administered 2015-01-21 (×2): 1 mg via INTRAVENOUS
  Filled 2015-01-21 (×2): qty 1

## 2015-01-21 NOTE — ED Notes (Signed)
Patient presents stating she has been laying around due to her back pain (chronic) and today she started with CP  That radiates to her back between her shoulder blades.  +SOB noted

## 2015-01-21 NOTE — ED Provider Notes (Signed)
CSN: [id number]     Arrival date & time 01/21/15  1912 History   First MD Initiated Contact with Patient 01/21/15 1946     Chief Complaint  Patient presents with  . Chest Pain  . Back Pain      HPI  Patient's presents with pain in her thoracic back. His history of chronic back pain. States she's been somewhat incapacitated with discomfort over the last several days and hasn't moved around much. When she gets up around now her back hurts her. It is it is painful to take a deep breath. She does not feel short of breath. She does not have a cough does not have hemoptysis. Chronic back pain followed at a pain clinic. No extremity pain and swelling. No history DVT.  Past Medical History  Diagnosis Date  . Back pain   . Sarcoidosis    Past Surgical History  Procedure Laterality Date  . Cholecystectomy    . Abdominal hysterectomy    . Appendectomy     No family history on file. History  Substance Use Topics  . Smoking status: Never Smoker   . Smokeless tobacco: Never Used  . Alcohol Use: Yes     Comment: ocassionally   OB History    No data available     Review of Systems  Constitutional: Negative for fever, chills, diaphoresis, appetite change and fatigue.  HENT: Negative for mouth sores, sore throat and trouble swallowing.   Eyes: Negative for visual disturbance.  Respiratory: Negative for cough, chest tightness, shortness of breath and wheezing.   Cardiovascular: Negative for chest pain.  Gastrointestinal: Negative for nausea, vomiting, abdominal pain, diarrhea and abdominal distention.  Endocrine: Negative for polydipsia, polyphagia and polyuria.  Genitourinary: Negative for dysuria, frequency and hematuria.  Musculoskeletal: Positive for back pain. Negative for gait problem.  Skin: Negative for color change, pallor and rash.  Neurological: Negative for dizziness, syncope, light-headedness and headaches.  Hematological: Does not bruise/bleed easily.    Psychiatric/Behavioral: Negative for behavioral problems and confusion.      Allergies  Tramadol  Home Medications   Prior to Admission medications   Medication Sig Start Date End Date Taking? Authorizing Provider  acetaminophen (TYLENOL) 500 MG tablet Take 500 mg by mouth every 6 (six) hours as needed for mild pain or moderate pain.   Yes Historical Provider, MD  amitriptyline (ELAVIL) 50 MG tablet Take 50 mg by mouth at bedtime. 09/22/14  Yes Historical Provider, MD  meloxicam (MOBIC) 15 MG tablet Take 15 mg by mouth at bedtime. 12/26/14  Yes Historical Provider, MD  Multiple Vitamins-Minerals (ONE DAILY MULTIVITAMIN WOMEN) TABS Take 1 tablet by mouth daily.   Yes Historical Provider, MD  oxymetazoline (AFRIN) 0.05 % nasal spray Place 1 spray into both nostrils 2 (two) times daily as needed for congestion.   Yes Historical Provider, MD  pregabalin (LYRICA) 50 MG capsule Take 50 mg by mouth 2 (two) times daily.   Yes Historical Provider, MD  Tapentadol HCl (NUCYNTA) 75 MG TABS Take 75 mg by mouth 5 (five) times daily.   Yes Historical Provider, MD   BP 110/66 mmHg  Pulse 88  Temp(Src) 97.9 F (36.6 C) (Oral)  Resp 22  Ht  (1.702 m)  Wt 202 lb (91.627 kg)  BMI 31.63 kg/m2  SpO2 97% Physical Exam  Constitutional: She is oriented to person, place, and time. She appears well-developed and well-nourished. No distress.  HENT:  Head: Normocephalic.  Eyes: Conjunctivae are normal. Pupils are  equal, round, and reactive to light. No scleral icterus.  Neck: Normal range of motion. Neck supple. No thyromegaly present.  Cardiovascular: Normal rate and regular rhythm.  Exam reveals no gallop and no friction rub.   No murmur heard. Pulmonary/Chest: Effort normal and breath sounds normal. No respiratory distress. She has no wheezes. She has no rales.  Abdominal: Soft. Bowel sounds are normal. She exhibits no distension. There is no tenderness. There is no rebound.  Musculoskeletal:  Normal range of motion.       Back:  Neurological: She is alert and oriented to person, place, and time.  Skin: Skin is warm and dry. No rash noted.  Psychiatric: She has a normal mood and affect. Her behavior is normal.    ED Course  Procedures (including critical care time) Labs Review Labs Reviewed  BASIC METABOLIC PANEL - Abnormal; Notable for the following:    Potassium 3.3 (*)    Glucose, Bld 144 (*)    GFR calc non Af Amer 69 (*)    GFR calc Af Amer 80 (*)    All other components within normal limits  CBC WITH DIFFERENTIAL/PLATELET  TROPONIN I    Imaging Review Dg Chest 2 View  01/21/2015   CLINICAL DATA:  Chest pain for 1 day.  EXAM: CHEST  2 VIEW  COMPARISON:  06/06/2013  FINDINGS: The heart size and mediastinal contours are within normal limits. Both lungs are clear. The visualized skeletal structures are unremarkable.  IMPRESSION: No active cardiopulmonary disease.   Electronically Signed   By: Burman NievesWilliam  Stevens M.D.   On: 01/21/2015 20:25   Ct Angio Chest Pe W/cm &/or Wo Cm  01/21/2015   CLINICAL DATA:  Chest pain radiating to the back. Shortness of breath. Symptoms for 8 hr.  EXAM: CT ANGIOGRAPHY CHEST WITH CONTRAST  TECHNIQUE: Multidetector CT imaging of the chest was performed using the standard protocol during bolus administration of intravenous contrast. Multiplanar CT image reconstructions and MIPs were obtained to evaluate the vascular anatomy.  CONTRAST:  80mL OMNIPAQUE IOHEXOL 350 MG/ML SOLN  COMPARISON:  Chest radiograph earlier this day. Chest CT PE protocol 06/06/2013  FINDINGS: There are no filling defects within the pulmonary arteries to suggest pulmonary embolus.  The thoracic aorta is normal in caliber. The heart size is normal. There is no pleural or pericardial effusion. No mediastinal or hilar adenopathy. The esophagus is normal.  Scattered linear atelectasis. No consolidation to suggest pneumonia. No pulmonary nodule or mass.  No acute abnormality in the  included upper abdomen. No acute or suspicious osseous abnormality.  Review of the MIP images confirms the above findings.  IMPRESSION: No pulmonary embolus.  No acute intrathoracic process.   Electronically Signed   By: Rubye OaksMelanie  Ehinger M.D.   On: 01/21/2015 22:11     EKG Interpretation None      MDM   Final diagnoses:  SOB (shortness of breath)     Patient with reproducible back pain. However she was tachycardic initially. CT after is not show clot. I think this is musculoskeletal pain she'll be treated with anti-inflammatory's, pain medicine, muscle relaxants.  Rolland PorterMark Cimone Fahey, MD 01/22/15 [phone]

## 2015-01-21 NOTE — Discharge Instructions (Signed)
Back Pain, Adult Low back pain is very common. About 1 in 5 people have back pain.The cause of low back pain is rarely dangerous. The pain often gets better over time.About half of people with a sudden onset of back pain feel better in just 2 weeks. About 8 in 10 people feel better by 6 weeks.  CAUSES Some common causes of back pain include:  Strain of the muscles or ligaments supporting the spine.  Wear and tear (degeneration) of the spinal discs.  Arthritis.  Direct injury to the back. DIAGNOSIS Most of the time, the direct cause of low back pain is not known.However, back pain can be treated effectively even when the exact cause of the pain is unknown.Answering your caregiver's questions about your overall health and symptoms is one of the most accurate ways to make sure the cause of your pain is not dangerous. If your caregiver needs more information, he or she may order lab work or imaging tests (X-rays or MRIs).However, even if imaging tests show changes in your back, this usually does not require surgery. HOME CARE INSTRUCTIONS For many people, back pain returns.Since low back pain is rarely dangerous, it is often a condition that people can learn to manageon their own.   Remain active. It is stressful on the back to sit or stand in one place. Do not sit, drive, or stand in one place for more than 30 minutes at a time. Take short walks on level surfaces as soon as pain allows.Try to increase the length of time you walk each day.  Do not stay in bed.Resting more than 1 or 2 days can delay your recovery.  Do not avoid exercise or work.Your body is made to move.It is not dangerous to be active, even though your back may hurt.Your back will likely heal faster if you return to being active before your pain is gone.  Pay attention to your body when you bend and lift. Many people have less discomfortwhen lifting if they bend their knees, keep the load close to their bodies,and  avoid twisting. Often, the most comfortable positions are those that put less stress on your recovering back.  Find a comfortable position to sleep. Use a firm mattress and lie on your side with your knees slightly bent. If you lie on your back, put a pillow under your knees.  Only take over-the-counter or prescription medicines as directed by your caregiver. Over-the-counter medicines to reduce pain and inflammation are often the most helpful.Your caregiver may prescribe muscle relaxant drugs.These medicines help dull your pain so you can more quickly return to your normal activities and healthy exercise.  Put ice on the injured area.  Put ice in a plastic bag.  Place a towel between your skin and the bag.  Leave the ice on for 15-20 minutes, 03-04 times a day for the first 2 to 3 days. After that, ice and heat may be alternated to reduce pain and spasms.  Ask your caregiver about trying back exercises and gentle massage. This may be of some benefit.  Avoid feeling anxious or stressed.Stress increases muscle tension and can worsen back pain.It is important to recognize when you are anxious or stressed and learn ways to manage it.Exercise is a great option. SEEK MEDICAL CARE IF:  You have pain that is not relieved with rest or medicine.  You have pain that does not improve in 1 week.  You have new symptoms.  You are generally not feeling well. SEEK   IMMEDIATE MEDICAL CARE IF:   You have pain that radiates from your back into your legs.  You develop new bowel or bladder control problems.  You have unusual weakness or numbness in your arms or legs.  You develop nausea or vomiting.  You develop abdominal pain.  You feel faint. Document Released: 11/14/2005 Document Revised: 05/15/2012 Document Reviewed: 03/18/2014 [employer] Patient Information 2015 [employer]. This information is not intended to replace advice given to you by your health care provider. Make sure you  discuss any questions you have with your health care provider.  

## 2015-03-18 ENCOUNTER — Encounter (HOSPITAL_COMMUNITY): Payer: Self-pay

## 2015-03-18 ENCOUNTER — Emergency Department (HOSPITAL_COMMUNITY)
Admission: EM | Admit: 2015-03-18 | Discharge: 2015-03-18 | Disposition: A | Discharge status: Home / Self Care | Payer: Self-pay | Attending: Emergency Medicine | Admitting: Emergency Medicine

## 2015-03-18 DIAGNOSIS — Z862 Personal history of diseases of the blood and blood-forming organs and certain disorders involving the immune mechanism: Secondary | ICD-10-CM | POA: Insufficient documentation

## 2015-03-18 DIAGNOSIS — R32 Unspecified urinary incontinence: Secondary | ICD-10-CM | POA: Insufficient documentation

## 2015-03-18 DIAGNOSIS — Z79899 Other long term (current) drug therapy: Secondary | ICD-10-CM | POA: Insufficient documentation

## 2015-03-18 DIAGNOSIS — M545 Low back pain: Secondary | ICD-10-CM | POA: Insufficient documentation

## 2015-03-18 DIAGNOSIS — G8929 Other chronic pain: Secondary | ICD-10-CM | POA: Insufficient documentation

## 2015-03-18 DIAGNOSIS — N39498 Other specified urinary incontinence: Secondary | ICD-10-CM

## 2015-03-18 DIAGNOSIS — M549 Dorsalgia, unspecified: Secondary | ICD-10-CM

## 2015-03-18 LAB — URINALYSIS, ROUTINE W REFLEX MICROSCOPIC
Bilirubin Urine: NEGATIVE
GLUCOSE, UA: NEGATIVE mg/dL
HGB URINE DIPSTICK: NEGATIVE
Ketones, ur: NEGATIVE mg/dL
Leukocytes, UA: NEGATIVE
Nitrite: NEGATIVE
PROTEIN: NEGATIVE mg/dL
SPECIFIC GRAVITY, URINE: 1.015 (ref 1.005–1.030)
Urobilinogen, UA: 0.2 mg/dL (ref 0.0–1.0)
pH: 6.5 (ref 5.0–8.0)

## 2015-03-18 MED ORDER — OXYCODONE-ACETAMINOPHEN 5-325 MG PO TABS
2.0000 | ORAL_TABLET | Freq: Once | ORAL | Status: AC
  Administered 2015-03-18: 2 via ORAL
  Filled 2015-03-18: qty 2

## 2015-03-18 MED ORDER — MELOXICAM 15 MG PO TABS: 15.0000 mg | ORAL_TABLET | Freq: Every day | ORAL | Status: DC

## 2015-03-18 NOTE — Discharge Instructions (Signed)
Return to the emergency room with worsening of symptoms, new symptoms or with symptoms that are concerning , especially fevers, loss of control of bladder or bowels, numbness or tingling around genital region or anus, weakness. Call to make appointment as soon as possible with the wellness center to establish care. Follow-up with urology for urinary incontinence as well as orthopedics for further workup. Read below information and follow recommendations.  Back Injury Prevention Back injuries can be extremely painful and difficult to heal. After having one back injury, you are much more likely to experience another later on. It is important to learn how to avoid injuring or re-injuring your back. The following tips can help you to prevent a back injury. PHYSICAL FITNESS  Exercise regularly and try to develop good tone in your abdominal muscles. Your abdominal muscles provide a lot of the support needed by your back.  Do aerobic exercises (walking, jogging, biking, swimming) regularly.  Do exercises that increase balance and strength (tai chi, yoga) regularly. This can decrease your risk of falling and injuring your back.  Stretch before and after exercising.  Maintain a healthy weight. The more you weigh, the more stress is placed on your back. For every pound of weight, 10 times that amount of pressure is placed on the back. DIET 1. Talk to your caregiver about how much calcium and vitamin D you need per day. These nutrients help to prevent weakening of the bones (osteoporosis). Osteoporosis can cause broken (fractured) bones that lead to back pain. 2. Include good sources of calcium in your diet, such as dairy products, green, leafy vegetables, and products with calcium added (fortified). 3. Include good sources of vitamin D in your diet, such as milk and foods that are fortified with vitamin D. 4. Consider taking a nutritional supplement or a multivitamin if needed. 5. Stop smoking if you  smoke. POSTURE  Sit and stand up straight. Avoid leaning forward when you sit or hunching over when you stand.  Choose chairs with good low back (lumbar) support.  If you work at a desk, sit close to your work so you do not need to lean over. Keep your chin tucked in. Keep your neck drawn back and elbows bent at a right angle. Your arms should look like the letter "L."  Sit high and close to the steering wheel when you drive. Add a lumbar support to your car seat if needed.  Avoid sitting or standing in one position for too long. Take breaks to get up, stretch, and walk around at least once every hour. Take breaks if you are driving for long periods of time.  Sleep on your side with your knees slightly bent, or sleep on your back with a pillow under your knees. Do not sleep on your stomach. LIFTING, TWISTING, AND REACHING  Avoid heavy lifting, especially repetitive lifting. If you must do heavy lifting:  Stretch before lifting.  Work slowly.  Rest between lifts.  Use carts and dollies to move objects when possible.  Make several small trips instead of carrying 1 heavy load.  Ask for help when you need it.  Ask for help when moving big, awkward objects.  Follow these steps when lifting:  Stand with your feet shoulder-width apart.  Get as close to the object as you can. Do not try to pick up heavy objects that are far from your body.  Use handles or lifting straps if they are available.  Bend at your knees. Squat down, but  keep your heels off the floor.  Keep your shoulders pulled back, your chin tucked in, and your back straight.  Lift the object slowly, tightening the muscles in your legs, abdomen, and buttocks. Keep the object as close to the center of your body as possible.  When you put a load down, use these same guidelines in reverse.  Do not:  Lift the object above your waist.  Twist at the waist while lifting or carrying a load. Move your feet if you need to  turn, not your waist.  Bend over without bending at your knees.  Avoid reaching over your head, across a table, or for an object on a high surface. OTHER TIPS  Avoid wet floors and keep sidewalks clear of ice to prevent falls.  Do not sleep on a mattress that is too soft or too hard.  Keep items that are used frequently within easy reach.  Put heavier objects on shelves at waist level and lighter objects on lower or higher shelves.  Find ways to decrease your stress, such as exercise, massage, or relaxation techniques. Stress can build up in your muscles. Tense muscles are more vulnerable to injury.  Seek treatment for depression or anxiety if needed. These conditions can increase your risk of developing back pain. SEEK MEDICAL CARE IF:  You injure your back.  You have questions about diet, exercise, or other ways to prevent back injuries. MAKE SURE YOU:  Understand these instructions.  Will watch your condition.  Will get help right away if you are not doing well or get worse. Document Released: 12/22/2004 Document Revised: 02/06/2012 Document Reviewed: 12/26/2011 [facility] Patient Information 2015 [place], [place]. This information is not intended to replace advice given to you by your health care provider. Make sure you discuss any questions you have with your health care provider. Urinary Incontinence Urinary incontinence is the involuntary loss of urine from your bladder. CAUSES  There are many causes of urinary incontinence. They include:  Medicines.  Infections.  Prostatic enlargement, leading to overflow of urine from your bladder.  Surgery.  Neurological diseases.  Emotional factors. SIGNS AND SYMPTOMS Urinary Incontinence can be divided into four types: 6. Urge incontinence. Urge incontinence is the involuntary loss of urine before you have the opportunity to go to the bathroom. There is a sudden urge to void but not enough time to reach a  bathroom. 7. Stress incontinence. Stress incontinence is the sudden loss of urine with any activity that forces urine to pass. It is commonly caused by anatomical changes to the pelvis and sphincter areas of your body. 8. Overflow incontinence. Overflow incontinence is the loss of urine from an obstructed opening to your bladder. This results in a backup of urine and a resultant buildup of pressure within the bladder. When the pressure within the bladder exceeds the closing pressure of the sphincter, the urine overflows, which causes incontinence, similar to water overflowing a dam. 9. Total incontinence. Total incontinence is the loss of urine as a result of the inability to store urine within your bladder. DIAGNOSIS  Evaluating the cause of incontinence may require:  A thorough and complete medical and obstetric history.  A complete physical exam.  Laboratory tests such as a urine culture and sensitivities. When additional tests are indicated, they can include:  An ultrasound exam.  Kidney and bladder X-rays.  Cystoscopy. This is an exam of the bladder using a narrow scope.  Urodynamic testing to test the nerve function to the bladder and  sphincter areas. TREATMENT  Treatment for urinary incontinence depends on the cause:  For urge incontinence caused by a bacterial infection, antibiotics will be prescribed. If the urge incontinence is related to medicines you take, your health care provider may have you change the medicine.  For stress incontinence, surgery to re-establish anatomical support to the bladder or sphincter, or both, will often correct the condition.  For overflow incontinence caused by an enlarged prostate, an operation to open the channel through the enlarged prostate will allow the flow of urine out of the bladder. In women with fibroids, a hysterectomy may be recommended.  For total incontinence, surgery on your urinary sphincter may help. An artificial urinary  sphincter (an inflatable cuff placed around the urethra) may be required. In women who have developed a hole-like passage between their bladder and vagina (vesicovaginal fistula), surgery to close the fistula often is required. HOME CARE INSTRUCTIONS  Normal daily hygiene and the use of pads or adult diapers that are changed regularly will help prevent odors and skin damage.  Avoid caffeine. It can overstimulate your bladder.  Use the bathroom regularly. Try about every 2-3 hours to go to the bathroom, even if you do not feel the need to do so. Take time to empty your bladder completely. After urinating, wait a minute. Then try to urinate again.  For causes involving nerve dysfunction, keep a log of the medicines you take and a journal of the times you go to the bathroom. SEEK MEDICAL CARE IF:  You experience worsening of pain instead of improvement in pain after your procedure.  Your incontinence becomes worse instead of better. SEE IMMEDIATE MEDICAL CARE IF:  You experience fever or shaking chills.  You are unable to pass your urine.  You have redness spreading into your groin or down into your thighs. MAKE SURE YOU:   Understand these instructions.   Will watch your condition.  Will get help right away if you are not doing well or get worse. Document Released: 12/22/2004 Document Revised: 09/04/2013 Document Reviewed: 04/23/2013 [facility] Patient Information 2015 [place], [place]. This information is not intended to replace advice given to you by your health care provider. Make sure you discuss any questions you have with your health care provider.

## 2015-03-18 NOTE — ED Provider Notes (Signed)
CSN: [id number]     Arrival date & time 03/18/15  1321 History   First MD Initiated Contact with Patient 03/18/15 [phone]     Chief Complaint  Patient presents with  . Back Pain     (Consider location/radiation/quality/duration/timing/severity/associated sxs/prior Treatment) HPI  Lauren Choi is a 49 y.o. female with PMH of chronic back pain presenting with bilateral low back pain for the past 2-3 days. Patient states it is sharp and achy. She states the pain radiates into her rectum and vagina down both legs. Worse with movement. She reports numbness and tingling down to her feet as well as episode of incontinence this morning when she woke up. Pt states she has had this for 2-3 months. Patient states she's had incontinence before with coughing. Patient states she has been able to control her bladder since this morning. No loss of control of bowel. No saddle anesthesia. Pt endorses mild dysuria but no hematuria, frequency. Patient has been taking Tylenol and naproxen without significant relief. Last time she saw an orthopedist was 4 years ago. She is in chronic pain but states she cannot afford it.   Past Medical History  Diagnosis Date  . Back pain   . Sarcoidosis    Past Surgical History  Procedure Laterality Date  . Cholecystectomy    . Abdominal hysterectomy    . Appendectomy     History reviewed. No pertinent family history. History  Substance Use Topics  . Smoking status: Never Smoker   . Smokeless tobacco: Never Used  . Alcohol Use: Yes     Comment: ocassionally   OB History    No data available     Review of Systems 10 Systems reviewed and are negative for acute change except as noted in the HPI.    Allergies  Lyrica and Tramadol  Home Medications   Prior to Admission medications   Medication Sig Start Date End Date Taking? Authorizing Provider  acetaminophen (TYLENOL) 500 MG tablet Take 500 mg by mouth every 6 (six) hours as needed for mild pain or moderate  pain.   Yes Historical Provider, MD  amitriptyline (ELAVIL) 50 MG tablet Take 50 mg by mouth at bedtime. 09/22/14  Yes Historical Provider, MD  Multiple Vitamins-Minerals (ONE DAILY MULTIVITAMIN WOMEN) TABS Take 1 tablet by mouth daily.   Yes Historical Provider, MD  oxymetazoline (AFRIN) 0.05 % nasal spray Place 1 spray into both nostrils 2 (two) times daily as needed for congestion.   Yes Historical Provider, MD  meloxicam (MOBIC) 15 MG tablet Take 1 tablet (15 mg total) by mouth daily. 03/18/15   Oswaldo Conroy, PA-C  methocarbamol (ROBAXIN) 500 MG tablet Take 1 tablet (500 mg total) by mouth 2 (two) times daily. Patient not taking: Reported on 03/18/2015 01/21/15   Rolland Porter, MD  methocarbamol (ROBAXIN) 500 MG tablet Take 1 tablet (500 mg total) by mouth 2 (two) times daily. Patient not taking: Reported on 03/18/2015 01/21/15   Rolland Porter, MD  naproxen (NAPROSYN) 500 MG tablet Take 1 tablet (500 mg total) by mouth 2 (two) times daily. Patient not taking: Reported on 03/18/2015 01/21/15   Rolland Porter, MD  oxyCODONE-acetaminophen (PERCOCET/ROXICET) 5-325 MG per tablet Take 2 tablets by mouth every 4 (four) hours as needed. Patient not taking: Reported on 03/18/2015 01/21/15   Rolland Porter, MD  oxyCODONE-acetaminophen (PERCOCET/ROXICET) 5-325 MG per tablet Take 2 tablets by mouth every 4 (four) hours as needed. Patient not taking: Reported on 03/18/2015 01/21/15   Rolland Porter,  MD   BP 123/82 mmHg  Pulse 79  Temp(Src) 98.2 F (36.8 C) (Oral)  Resp 18  Ht 5\' 7"  (1.702 m)  Wt 198 lb (89.812 kg)  BMI 31.00 kg/m2  SpO2 98% Physical Exam  Constitutional: She appears well-developed and well-nourished. No distress.  HENT:  Head: Normocephalic and atraumatic.  Eyes: Conjunctivae are normal. Right eye exhibits no discharge. Left eye exhibits no discharge.  Cardiovascular: Normal rate, regular rhythm and normal heart sounds.   Pulmonary/Chest: Effort normal and breath sounds normal. No respiratory distress.  She has no wheezes.  Abdominal: Soft. Bowel sounds are normal. She exhibits no distension. There is no tenderness.  Genitourinary:  External rectum without tenderness to palpation without visible fissures. Internal rectum with normal tone without masses or lesions. Peritoneal sensation intact. Nursing tech in room during exam.   Musculoskeletal:  No midline back tenderness, step off or crepitus. Right and Left sided lower back tenderness. No CVA tenderness.  Neurological: She is alert. Coordination normal.  Equal muscle tone. 5/5 strength in lower extremities. DTR equal and intact. Negative straight leg test. Antalgic gait.  Skin: Skin is warm and dry. She is not diaphoretic.  Nursing note and vitals reviewed.   ED Course  Procedures (including critical care time) Labs Review Labs Reviewed  URINALYSIS, ROUTINE W REFLEX MICROSCOPIC    Imaging Review No results found.   EKG Interpretation None      MRI 08/01/2014 IMPRESSION: No acute fracture, nor malalignment.  Lumbar spondylosis. Small L4-5 and L5-S1 disc protrusions with annular fissures.  Minimal canal stenosis at L4-5. Mild L5-S1 neural foraminal narrowing.   MDM   Final diagnoses:  Chronic back pain  Other urinary incontinence   Patient presenting with worsening bilateral low back pain worse with movement and ambulation. Pt with numbness tingling but no weakness. Pt reports urinary incontinence that has been ongoing for 2-3 months. Worse with coughing or sneezing. Pt with MRI 07/2014 with impression above. DTR intact and equal. Normal rectal tone and peritoneal sensation intact. Pt ambulatory. I doubt cauda equina. UA negative for infection. Pt to follow up with urology for incontinence and ortho for back pain. Pt given referral to wellness center for PCP.   Discussed return precautions with patient. Discussed all results and patient verbalizes understanding and agrees with plan.  Case has been discussed with Dr.  Hyacinth MeekerMiller who agrees with the above plan and to discharge.    Oswaldo ConroyVictoria Lashina Milles, PA-C 03/18/15 1837  Eber HongBrian Miller, MD 03/19/15 [id number]

## 2015-03-18 NOTE — ED Notes (Signed)
Pt presents with 2-3 day h/o low back pain, denies any injury, reports pain radiates into rectum and vagina and down both legs; reports incontinence of urine.

## 2015-05-14 ENCOUNTER — Encounter (HOSPITAL_COMMUNITY): Payer: Self-pay | Admitting: *Deleted

## 2015-05-14 ENCOUNTER — Emergency Department (HOSPITAL_COMMUNITY)
Admission: EM | Admit: 2015-05-14 | Discharge: 2015-05-14 | Disposition: A | Discharge status: Home / Self Care | Payer: Self-pay | Attending: Emergency Medicine | Admitting: Emergency Medicine

## 2015-05-14 DIAGNOSIS — Z79899 Other long term (current) drug therapy: Secondary | ICD-10-CM | POA: Insufficient documentation

## 2015-05-14 DIAGNOSIS — M549 Dorsalgia, unspecified: Secondary | ICD-10-CM

## 2015-05-14 DIAGNOSIS — R300 Dysuria: Secondary | ICD-10-CM | POA: Insufficient documentation

## 2015-05-14 DIAGNOSIS — R200 Anesthesia of skin: Secondary | ICD-10-CM | POA: Insufficient documentation

## 2015-05-14 DIAGNOSIS — Z862 Personal history of diseases of the blood and blood-forming organs and certain disorders involving the immune mechanism: Secondary | ICD-10-CM | POA: Insufficient documentation

## 2015-05-14 DIAGNOSIS — M545 Low back pain: Secondary | ICD-10-CM | POA: Insufficient documentation

## 2015-05-14 DIAGNOSIS — R109 Unspecified abdominal pain: Secondary | ICD-10-CM | POA: Insufficient documentation

## 2015-05-14 DIAGNOSIS — G8929 Other chronic pain: Secondary | ICD-10-CM | POA: Insufficient documentation

## 2015-05-14 LAB — URINALYSIS, ROUTINE W REFLEX MICROSCOPIC
BILIRUBIN URINE: NEGATIVE
Glucose, UA: NEGATIVE mg/dL
Hgb urine dipstick: NEGATIVE
KETONES UR: NEGATIVE mg/dL
Leukocytes, UA: NEGATIVE
Nitrite: NEGATIVE
Protein, ur: NEGATIVE mg/dL
Specific Gravity, Urine: 1.009 (ref 1.005–1.030)
UROBILINOGEN UA: 0.2 mg/dL (ref 0.0–1.0)
pH: 7 (ref 5.0–8.0)

## 2015-05-14 MED ORDER — FENTANYL CITRATE (PF) 100 MCG/2ML IJ SOLN
100.0000 ug | Freq: Once | INTRAMUSCULAR | Status: AC
  Administered 2015-05-14: 100 ug via INTRAMUSCULAR
  Filled 2015-05-14: qty 2

## 2015-05-14 MED ORDER — KETOROLAC TROMETHAMINE 60 MG/2ML IM SOLN
60.0000 mg | Freq: Once | INTRAMUSCULAR | Status: AC
  Administered 2015-05-14: 60 mg via INTRAMUSCULAR
  Filled 2015-05-14: qty 2

## 2015-05-14 NOTE — ED Notes (Signed)
Attempted to get patient up and to the bathroom. Pt states she can't walk. Pt walks at home with no assistance.

## 2015-05-14 NOTE — Discharge Instructions (Signed)
Follow-up with orthopedic group. Continue to follow the consultations they've the scheduled for you. Rest today. Return for any new or worse symptoms.

## 2015-05-14 NOTE — ED Notes (Signed)
Pt arrives from home via [facility]. Pt states she began having urinary frequency on Monday that has progressed since then and is now having back pain that radiates around to the lower abdomen bilaterally. Pt has a hx of chronic back pain and was being seen at a pain management facility for this, but states this feels different than what she is used to dealing with. Pt states she was seen at [facility] ED on Tuesday for HTN and also told the MD there about her back pain and they suggested getting back to her pain management clinic. Pt states sx continue to worsen.

## 2015-05-14 NOTE — ED Notes (Signed)
Pt is in stable condition upon d/c and is escorted by staff via wheelchair from the ED.

## 2015-05-14 NOTE — ED Provider Notes (Signed)
CSN: [id number]     Arrival date & time 05/14/15  1002 History   First MD Initiated Contact with Patient 05/14/15 1021     Chief Complaint  Patient presents with  . Urinary Frequency  . Abdominal Pain  . Flank Pain     (Consider location/radiation/quality/duration/timing/severity/associated sxs/prior Treatment) Patient is a 49 y.o. female presenting with frequency, abdominal pain, and flank pain. The history is provided by the patient and the EMS personnel.  Urinary Frequency Associated symptoms include abdominal pain. Pertinent negatives include no headaches and no shortness of breath.  Abdominal Pain Associated symptoms: dysuria   Associated symptoms: no fever and no shortness of breath   Flank Pain Associated symptoms include abdominal pain. Pertinent negatives include no headaches and no shortness of breath.   patient with a history of chronic back pain. Brought in by [facility]. Patient states her having urinary frequency on Monday this morning awoke with the unusual and different low back pain radiating around to the front and down into her left legs with numbness in both legs. Patient has a history of chronic back pain. Patient is followed by orthopedics locally and has been referred to a pain management clinic but has not been seen by them yet. Patient denies any recent traumas or falls. Patient was seen in the emergency department at [facility] on Tuesday for high blood pressure. Patient stating not able to walk very well however was able to walk out of the house today.  Past Medical History  Diagnosis Date  . Back pain   . Sarcoidosis    Past Surgical History  Procedure Laterality Date  . Cholecystectomy    . Abdominal hysterectomy    . Appendectomy     History reviewed. No pertinent family history. History  Substance Use Topics  . Smoking status: Never Smoker   . Smokeless tobacco: Never Used  . Alcohol Use: Yes     Comment: ocassionally   OB History    No data available      Review of Systems  Constitutional: Negative for fever.  HENT: Negative for congestion.   Eyes: Negative for redness.  Respiratory: Negative for shortness of breath.   Gastrointestinal: Positive for abdominal pain.  Genitourinary: Positive for dysuria, frequency and flank pain.  Musculoskeletal: Positive for back pain.  Neurological: Positive for numbness. Negative for headaches.  Hematological: Does not bruise/bleed easily.  Psychiatric/Behavioral: Negative for confusion.      Allergies  Lyrica; Tomato; and Tramadol  Home Medications   Prior to Admission medications   Medication Sig Start Date End Date Taking? Authorizing Provider  acetaminophen (TYLENOL) 500 MG tablet Take 500 mg by mouth every 4 (four) hours as needed for mild pain or moderate pain.    Yes Historical Provider, MD  amitriptyline (ELAVIL) 50 MG tablet Take 50 mg by mouth at bedtime. 09/22/14  Yes Historical Provider, MD  diphenhydrAMINE (BENADRYL) 25 mg capsule Take 25 mg by mouth every 6 (six) hours as needed for allergies.   Yes Historical Provider, MD  gabapentin (NEURONTIN) 100 MG capsule Take 100 mg by mouth. 05/13/15 05/23/15 Yes Historical Provider, MD  GARCINIA CAMBOGIA-CHROMIUM PO Take 2 capsules by mouth daily.   Yes Historical Provider, MD  meloxicam (MOBIC) 15 MG tablet Take 1 tablet (15 mg total) by mouth daily. 03/18/15  Yes Oswaldo Conroy, PA-C  Multiple Vitamins-Minerals (ONE DAILY MULTIVITAMIN WOMEN) TABS Take 1 tablet by mouth daily.   Yes Historical Provider, MD  oxymetazoline (AFRIN) 0.05 % nasal spray Place  1 spray into both nostrils 2 (two) times daily as needed for congestion.   Yes Historical Provider, MD  methocarbamol (ROBAXIN) 500 MG tablet Take 1 tablet (500 mg total) by mouth 2 (two) times daily. Patient not taking: Reported on 03/18/2015 01/21/15   Rolland Porter, MD  methocarbamol (ROBAXIN) 500 MG tablet Take 1 tablet (500 mg total) by mouth 2 (two) times daily. Patient not taking:  Reported on 03/18/2015 01/21/15   Rolland Porter, MD  naproxen (NAPROSYN) 500 MG tablet Take 1 tablet (500 mg total) by mouth 2 (two) times daily. Patient not taking: Reported on 03/18/2015 01/21/15   Rolland Porter, MD  oxyCODONE-acetaminophen (PERCOCET/ROXICET) 5-325 MG per tablet Take 2 tablets by mouth every 4 (four) hours as needed. Patient not taking: Reported on 03/18/2015 01/21/15   Rolland Porter, MD  oxyCODONE-acetaminophen (PERCOCET/ROXICET) 5-325 MG per tablet Take 2 tablets by mouth every 4 (four) hours as needed. Patient not taking: Reported on 03/18/2015 01/21/15   Rolland Porter, MD   BP 138/79 mmHg  Pulse 65  Temp(Src) 98.4 F (36.9 C) (Oral)  Ht  (1.702 m)  Wt 208 lb (94.348 kg)  BMI 32.57 kg/m2  SpO2 97% Physical Exam  Constitutional: She is oriented to person, place, and time. She appears well-developed and well-nourished. No distress.  HENT:  Head: Normocephalic and atraumatic.  Mouth/Throat: Oropharynx is clear and moist.  Eyes: Conjunctivae and EOM are normal. Pupils are equal, round, and reactive to light.  Neck: Normal range of motion.  Cardiovascular: Normal rate, regular rhythm and normal heart sounds.   No murmur heard. Pulmonary/Chest: Effort normal and breath sounds normal.  Abdominal: Soft. Bowel sounds are normal. There is no tenderness.  Musculoskeletal: Normal range of motion. She exhibits tenderness.  Tenderness to palpation to low back. Able to move both legs neurovascular intact.  Neurological: She is alert and oriented to person, place, and time. No cranial nerve deficit. She exhibits normal muscle tone.  Skin: Skin is warm. No rash noted.  Nursing note and vitals reviewed.   ED Course  Procedures (including critical care time) Labs Review Labs Reviewed  URINALYSIS, ROUTINE W REFLEX MICROSCOPIC (NOT AT [facility]) - Abnormal; Notable for the following:    APPearance CLOUDY (*)    All other components within normal limits   Results for orders placed or performed  during the hospital encounter of 05/14/15  Urinalysis, Routine w reflex microscopic (not at [facility])  Result Value Ref Range   Color, Urine YELLOW YELLOW   APPearance CLOUDY (A) CLEAR   Specific Gravity, Urine 1.009 1.005 - 1.030   pH 7.0 5.0 - 8.0   Glucose, UA NEGATIVE NEGATIVE mg/dL   Hgb urine dipstick NEGATIVE NEGATIVE   Bilirubin Urine NEGATIVE NEGATIVE   Ketones, ur NEGATIVE NEGATIVE mg/dL   Protein, ur NEGATIVE NEGATIVE mg/dL   Urobilinogen, UA 0.2 0.0 - 1.0 mg/dL   Nitrite NEGATIVE NEGATIVE   Leukocytes, UA NEGATIVE NEGATIVE    Imaging Review No results found.   EKG Interpretation None      MDM   Final diagnoses:  Chronic back pain    Patient with a history of chronic back pain. Followed by Dr. August Saucer orthopedics. Patient also been referred to the pain management. Patient states that new and worse discomfort bilateral low back radiating around to the front of the abdomen. Associated with urinary frequency. Urinary frequency started on Monday.  Patient's abdomen is soft and nontender. No evidence urinary tract infection. Symptoms seem to be most consistent  with back. Made worse with any kind of movement. Patient able to stand but has significant discomfort when trying to walk.  Patient treated here with fentanyl 100 g IM and Toradol 60 mg IM. Patient looks more comfortable but states it is no real significant improvement. Agent does have numbness radiating to both legs however neurovascularly is intact to the lower extremities.   Patient will be discharged follow back up with orthopedics.   Vanetta Mulders, MD 05/14/15 1353

## 2016-11-12 ENCOUNTER — Emergency Department (HOSPITAL_COMMUNITY)
Admission: EM | Admit: 2016-11-12 | Discharge: 2016-11-12 | Disposition: A | Discharge status: Home / Self Care | Payer: Medicaid Other | Attending: Emergency Medicine | Admitting: Emergency Medicine

## 2016-11-12 ENCOUNTER — Emergency Department (HOSPITAL_COMMUNITY): Payer: Medicaid Other

## 2016-11-12 ENCOUNTER — Encounter (HOSPITAL_COMMUNITY): Payer: Self-pay | Admitting: Emergency Medicine

## 2016-11-12 DIAGNOSIS — Z79899 Other long term (current) drug therapy: Secondary | ICD-10-CM | POA: Insufficient documentation

## 2016-11-12 DIAGNOSIS — Y939 Activity, unspecified: Secondary | ICD-10-CM | POA: Insufficient documentation

## 2016-11-12 DIAGNOSIS — S300XXA Contusion of lower back and pelvis, initial encounter: Secondary | ICD-10-CM | POA: Insufficient documentation

## 2016-11-12 DIAGNOSIS — S39012A Strain of muscle, fascia and tendon of lower back, initial encounter: Secondary | ICD-10-CM | POA: Diagnosis not present

## 2016-11-12 DIAGNOSIS — S3992XA Unspecified injury of lower back, initial encounter: Secondary | ICD-10-CM | POA: Diagnosis present

## 2016-11-12 DIAGNOSIS — W108XXA Fall (on) (from) other stairs and steps, initial encounter: Secondary | ICD-10-CM | POA: Insufficient documentation

## 2016-11-12 DIAGNOSIS — Y999 Unspecified external cause status: Secondary | ICD-10-CM | POA: Insufficient documentation

## 2016-11-12 DIAGNOSIS — Y929 Unspecified place or not applicable: Secondary | ICD-10-CM | POA: Diagnosis not present

## 2016-11-12 HISTORY — DX: Fibromyalgia: M79.7

## 2016-11-12 MED ORDER — IBUPROFEN 600 MG PO TABS: 600.0000 mg | ORAL_TABLET | Freq: Four times a day (QID) | ORAL | 0 refills | Status: DC | PRN

## 2016-11-12 MED ORDER — OXYCODONE-ACETAMINOPHEN 5-325 MG PO TABS
1.0000 | ORAL_TABLET | Freq: Once | ORAL | Status: AC
  Administered 2016-11-12: 1 via ORAL
  Filled 2016-11-12: qty 1

## 2016-11-12 MED ORDER — HYDROCODONE-ACETAMINOPHEN 5-325 MG PO TABS: 1.0000 | ORAL_TABLET | Freq: Four times a day (QID) | ORAL | 0 refills | Status: DC | PRN

## 2016-11-12 NOTE — ED Triage Notes (Signed)
Pt slipped and fell yesterday, landed on buttocks and is now having lower back to tailbone pain. Pt states while walking a tingling sensation from lower back down legs. Pain when sitting directly on bottom. Denies any urinary symptoms.

## 2016-11-12 NOTE — Discharge Instructions (Signed)
Ibuprofen for pain. Norco for severe pain. Continue your Flexeril and gabapentin. Try heating pads, stretches, stay active. Follow-up with primary care doctor on Monday. Return if urinary or bowel incontinence, numbness to your legs, inability to move her feet.

## 2016-11-12 NOTE — ED Provider Notes (Signed)
[facility]-EMERGENCY DEPT Provider Note   CSN: [id number] Arrival date & time: 11/12/16  1854     History   Chief Complaint Chief Complaint  Patient presents with  . Back Pain  . Fall    HPI Lauren Choi is a 50 y.o. female.  HPI Lauren Choi is a 50 y.o. female with history of fibromyalgia and sarcoidosis, chronic back pain, presents to emergency department complaining of a fall and back pain. Patient states she fell yesterday after missing 2 steps, states landed onto her bottom. She reports lower back pain and coccyx pain since the fall. She states she still able to ambulate. No trouble controlling her bladder or bowels. She reports pain radiating down both legs. She reports no numbness in her legs but states he felt weak. She denies any fever or chills. She has been taking Tylenol, gabapentin, with no relief of her symptoms. She states currently seeing a specialist for her lower back pain. No prior surgeries. No head injury or loss of consciousness.  Past Medical History:  Diagnosis Date  . Back pain   . Fibromyalgia   . Sarcoidosis (HCC)     There are no active problems to display for this patient.   Past Surgical History:  Procedure Laterality Date  . ABDOMINAL HYSTERECTOMY    . APPENDECTOMY    . CHOLECYSTECTOMY      OB History    No data available       Home Medications    Prior to Admission medications   Medication Sig Start Date End Date Taking? Authorizing Provider  acetaminophen (TYLENOL) 500 MG tablet Take 500 mg by mouth every 4 (four) hours as needed for mild pain or moderate pain.     Historical Provider, MD  amitriptyline (ELAVIL) 50 MG tablet Take 50 mg by mouth at bedtime. 09/22/14   Historical Provider, MD  diphenhydrAMINE (BENADRYL) 25 mg capsule Take 25 mg by mouth every 6 (six) hours as needed for allergies.    Historical Provider, MD  gabapentin (NEURONTIN) 100 MG capsule Take 100 mg by mouth. 05/13/15 05/23/15  Historical Provider, MD    GARCINIA CAMBOGIA-CHROMIUM PO Take 2 capsules by mouth daily.    Historical Provider, MD  meloxicam (MOBIC) 15 MG tablet Take 1 tablet (15 mg total) by mouth daily. 03/18/15   Oswaldo ConroyVictoria Creech, PA-C  methocarbamol (ROBAXIN) 500 MG tablet Take 1 tablet (500 mg total) by mouth 2 (two) times daily. Patient not taking: Reported on 03/18/2015 01/21/15   Rolland PorterMark James, MD  methocarbamol (ROBAXIN) 500 MG tablet Take 1 tablet (500 mg total) by mouth 2 (two) times daily. Patient not taking: Reported on 03/18/2015 01/21/15   Rolland PorterMark James, MD  Multiple Vitamins-Minerals (ONE DAILY MULTIVITAMIN WOMEN) TABS Take 1 tablet by mouth daily.    Historical Provider, MD  naproxen (NAPROSYN) 500 MG tablet Take 1 tablet (500 mg total) by mouth 2 (two) times daily. Patient not taking: Reported on 03/18/2015 01/21/15   Rolland PorterMark James, MD  oxyCODONE-acetaminophen (PERCOCET/ROXICET) 5-325 MG per tablet Take 2 tablets by mouth every 4 (four) hours as needed. Patient not taking: Reported on 03/18/2015 01/21/15   Rolland PorterMark James, MD  oxyCODONE-acetaminophen (PERCOCET/ROXICET) 5-325 MG per tablet Take 2 tablets by mouth every 4 (four) hours as needed. Patient not taking: Reported on 03/18/2015 01/21/15   Rolland PorterMark James, MD  oxymetazoline (AFRIN) 0.05 % nasal spray Place 1 spray into both nostrils 2 (two) times daily as needed for congestion.    Historical Provider, MD  Family History No family history on file.  Social History Social History  Substance Use Topics  . Smoking status: Never Smoker  . Smokeless tobacco: Never Used  . Alcohol use Yes     Comment: ocassionally     Allergies   Lyrica [pregabalin]; Tomato; and Tramadol   Review of Systems Review of Systems  Constitutional: Negative for chills and fever.  Respiratory: Negative for cough, chest tightness and shortness of breath.   Cardiovascular: Negative for chest pain, palpitations and leg swelling.  Gastrointestinal: Negative for abdominal pain, diarrhea, nausea and  vomiting.  Genitourinary: Negative for dysuria and flank pain.  Musculoskeletal: Positive for arthralgias and back pain. Negative for myalgias, neck pain and neck stiffness.  Skin: Negative for rash.  Neurological: Negative for dizziness, weakness and headaches.  All other systems reviewed and are negative.    Physical Exam Updated Vital Signs BP 132/83 (BP Location: Right Arm)   Pulse 114   Temp 98.2 F (36.8 C)   Resp 20   Ht 5\' 7"  (1.702 m)   Wt 96.2 kg   SpO2 98%   BMI 33.20 kg/m   Physical Exam  Constitutional: She is oriented to person, place, and time. She appears well-developed and well-nourished. No distress.  HENT:  Head: Normocephalic.  Eyes: Conjunctivae are normal.  Neck: Neck supple.  Cardiovascular: Normal rate, regular rhythm and normal heart sounds.   Pulmonary/Chest: Effort normal and breath sounds normal. No respiratory distress. She has no wheezes. She has no rales.  Abdominal: Soft. Bowel sounds are normal. She exhibits no distension. There is no tenderness. There is no rebound.  Musculoskeletal: She exhibits no edema.  Midline lumbar and sacral tenderness. Diffuse paravertebral tenderness and tenderness over the entire lower back Stephen light palpation. Pain with any range of motion of bilateral hips. Pain in the back with any knee extension. Pain with bilateral straight leg raise.  Neurological: She is alert and oriented to person, place, and time.  Pt refusing to extend knees, dorsiflex, plantarflex feet due to pain. Pt is ambulatory with slow steady gait without dragging her feet.   2+ and equal patellar reflexes bilaterally. Equal sensation bilaterally over thighs and lower legs.   Skin: Skin is warm and dry.  Psychiatric: She has a normal mood and affect. Her behavior is normal.  Nursing note and vitals reviewed.    ED Treatments / Results  Labs (all labs ordered are listed, but only abnormal results are displayed) Labs Reviewed - No data to  display  EKG  EKG Interpretation None       Radiology Dg Lumbar Spine Complete  Result Date: 11/12/2016 CLINICAL DATA:  50 year old female with fall and back pain. EXAM: LUMBAR SPINE - COMPLETE 4+ VIEW; SACRUM AND COCCYX - 2+ VIEW COMPARISON:  Lumbar spine MRI dated 08/01/2014 FINDINGS: There is no acute fracture or subluxation of the lumbar spine. The vertebral body heights and disc spaces are maintained. There is grade 1 L5-S1 retrolisthesis. The visualized transverse and spinous processes appear intact. Facet hypertrophy at L4-L5 and L5-S1. The soft tissues appear unremarkable. A surgical clip noted over the pelvis. IMPRESSION: No acute/ traumatic lumbar spine or sacral pathology. Electronically Signed   By: Elgie Collard M.D.   On: 11/12/2016 21:32   Dg Sacrum/coccyx  Result Date: 11/12/2016 CLINICAL DATA:  50 year old female with fall and back pain. EXAM: LUMBAR SPINE - COMPLETE 4+ VIEW; SACRUM AND COCCYX - 2+ VIEW COMPARISON:  Lumbar spine MRI dated 08/01/2014 FINDINGS: There is  no acute fracture or subluxation of the lumbar spine. The vertebral body heights and disc spaces are maintained. There is grade 1 L5-S1 retrolisthesis. The visualized transverse and spinous processes appear intact. Facet hypertrophy at L4-L5 and L5-S1. The soft tissues appear unremarkable. A surgical clip noted over the pelvis. IMPRESSION: No acute/ traumatic lumbar spine or sacral pathology. Electronically Signed   By: Elgie CollardArash  Radparvar M.D.   On: 11/12/2016 21:32    Procedures Procedures (including critical care time)  Medications Ordered in ED Medications  oxyCODONE-acetaminophen (PERCOCET/ROXICET) 5-325 MG per tablet 1 tablet (1 tablet Oral Given 11/12/16 2038)     Initial Impression / Assessment and Plan / ED Course  I have reviewed the triage vital signs and the nursing notes.  Pertinent labs & imaging results that were available during my care of the patient were reviewed by me and considered  in my medical decision making (see chart for details).  Clinical Course    Patient is an emergency department after mechanical fall, falling down 2 steps. Reports landing on her bottom. Complaining of lower back and sacral pain, pain into her coccyx. History of chronic pain to her back. Currently on gabapentin and Flexeril, Tylenol for pain home. Patient was seen walking into the emergency department, and she walked into the room. On my exam, patient is out of proportion tender all over her back, to even light touch. She is refusing to move her legs. I'm unable to assess full strength in her lower extremities, since she will not move her legs. She again was seen walking in and out of her wheelchair to and from radiology. I did treat her pain with Percocet in the emergency department. We'll discharge home with 10 tablets of Norco, until she is able to follow with her primary care doctor. Her x-rays are negative today. She'll continue to take her Flexeril, gabapentin. I does time I do not suspect cauda equina. I did give her all of the symptoms that she warrant her return back to the emergency department.  Vitals:   11/12/16 1901 11/12/16 1919 11/12/16 1920  BP: 106/75 132/83   Pulse: 115 114   Resp: 16 20   Temp: 99 F (37.2 C) 98.2 F (36.8 C)   TempSrc: Oral    SpO2: 98% 98%   Weight: 103.4 kg  96.2 kg  Height: 5' 7.5" (1.715 m)  5\' 7"  (1.702 m)     Final Clinical Impressions(s) / ED Diagnoses   Final diagnoses:  Strain of lumbar region, initial encounter  Contusion of coccyx, initial encounter    New Prescriptions New Prescriptions   HYDROCODONE-ACETAMINOPHEN (NORCO) 5-325 MG TABLET    Take 1 tablet by mouth every 6 (six) hours as needed for moderate pain.   IBUPROFEN (ADVIL,MOTRIN) 600 MG TABLET    Take 1 tablet (600 mg total) by mouth every 6 (six) hours as needed.     Jaynie Crumbleatyana Kinston Magnan, PA-C 11/12/16 2215    Loren Raceravid Yelverton, MD 11/12/16 [phone]

## 2017-03-14 ENCOUNTER — Emergency Department (HOSPITAL_COMMUNITY)
Admission: EM | Admit: 2017-03-14 | Discharge: 2017-03-14 | Disposition: A | Discharge status: Home / Self Care | Payer: Medicaid Other | Attending: Emergency Medicine | Admitting: Emergency Medicine

## 2017-03-14 ENCOUNTER — Emergency Department (HOSPITAL_COMMUNITY): Payer: Medicaid Other

## 2017-03-14 DIAGNOSIS — Z79899 Other long term (current) drug therapy: Secondary | ICD-10-CM | POA: Insufficient documentation

## 2017-03-14 DIAGNOSIS — B349 Viral infection, unspecified: Secondary | ICD-10-CM | POA: Diagnosis not present

## 2017-03-14 DIAGNOSIS — R5383 Other fatigue: Secondary | ICD-10-CM | POA: Diagnosis present

## 2017-03-14 DIAGNOSIS — Z5181 Encounter for therapeutic drug level monitoring: Secondary | ICD-10-CM | POA: Insufficient documentation

## 2017-03-14 LAB — COMPREHENSIVE METABOLIC PANEL
ALBUMIN: 4 g/dL (ref 3.5–5.0)
ALK PHOS: 82 U/L (ref 38–126)
ALT: 33 U/L (ref 14–54)
AST: 44 U/L — AB (ref 15–41)
Anion gap: 14 (ref 5–15)
BUN: 11 mg/dL (ref 6–20)
CALCIUM: 9.9 mg/dL (ref 8.9–10.3)
CO2: 20 mmol/L — AB (ref 22–32)
CREATININE: 1.31 mg/dL — AB (ref 0.44–1.00)
Chloride: 103 mmol/L (ref 101–111)
GFR calc Af Amer: 54 mL/min — ABNORMAL LOW (ref >60)
GFR calc non Af Amer: 46 mL/min — ABNORMAL LOW (ref >60)
GLUCOSE: 148 mg/dL — AB (ref 65–99)
Potassium: 3.5 mmol/L (ref 3.5–5.1)
Sodium: 137 mmol/L (ref 135–145)
Total Bilirubin: 0.4 mg/dL (ref 0.3–1.2)
Total Protein: 7.8 g/dL (ref 6.5–8.1)

## 2017-03-14 LAB — CBC WITH DIFFERENTIAL/PLATELET
Basophils Absolute: 0 10*3/uL (ref 0.0–0.1)
Basophils Relative: 0 %
Eosinophils Absolute: 0.1 10*3/uL (ref 0.0–0.7)
Eosinophils Relative: 1 %
HEMATOCRIT: 39 % (ref 36.0–46.0)
Hemoglobin: 12.7 g/dL (ref 12.0–15.0)
LYMPHS ABS: 1.8 10*3/uL (ref 0.7–4.0)
LYMPHS PCT: 20 %
MCH: 28.9 pg (ref 26.0–34.0)
MCHC: 32.6 g/dL (ref 30.0–36.0)
MCV: 88.6 fL (ref 78.0–100.0)
MONO ABS: 0.4 10*3/uL (ref 0.1–1.0)
Monocytes Relative: 4 %
NEUTROS ABS: 6.6 10*3/uL (ref 1.7–7.7)
Neutrophils Relative %: 75 %
PLATELETS: 275 10*3/uL (ref 150–400)
RBC: 4.4 MIL/uL (ref 3.87–5.11)
RDW: 12.4 % (ref 11.5–15.5)
WBC: 8.8 10*3/uL (ref 4.0–10.5)

## 2017-03-14 LAB — INFLUENZA PANEL BY PCR (TYPE A & B)
INFLBPCR: NEGATIVE
Influenza A By PCR: NEGATIVE

## 2017-03-14 LAB — URINALYSIS, ROUTINE W REFLEX MICROSCOPIC
BACTERIA UA: NONE SEEN
Bilirubin Urine: NEGATIVE
GLUCOSE, UA: NEGATIVE mg/dL
KETONES UR: NEGATIVE mg/dL
Leukocytes, UA: NEGATIVE
NITRITE: NEGATIVE
PROTEIN: NEGATIVE mg/dL
Specific Gravity, Urine: 1.023 (ref 1.005–1.030)
pH: 5 (ref 5.0–8.0)

## 2017-03-14 LAB — I-STAT CG4 LACTIC ACID, ED
Lactic Acid, Venous: 1.27 mmol/L (ref 0.5–1.9)
Lactic Acid, Venous: 2.45 mmol/L (ref 0.5–1.9)

## 2017-03-14 LAB — PROTIME-INR
INR: 0.99
Prothrombin Time: 13.1 seconds (ref 11.4–15.2)

## 2017-03-14 MED ORDER — ACETAMINOPHEN 500 MG PO TABS
ORAL_TABLET | ORAL | Status: AC
  Filled 2017-03-14: qty 2

## 2017-03-14 MED ORDER — KETOROLAC TROMETHAMINE 30 MG/ML IJ SOLN
30.0000 mg | Freq: Once | INTRAMUSCULAR | Status: AC
  Administered 2017-03-14: 30 mg via INTRAVENOUS
  Filled 2017-03-14: qty 1

## 2017-03-14 MED ORDER — ACETAMINOPHEN 325 MG PO TABS
325.0000 mg | ORAL_TABLET | Freq: Once | ORAL | Status: AC
  Administered 2017-03-14: 325 mg via ORAL
  Filled 2017-03-14: qty 1

## 2017-03-14 MED ORDER — ACETAMINOPHEN 325 MG PO TABS
650.0000 mg | ORAL_TABLET | Freq: Once | ORAL | Status: AC | PRN
  Administered 2017-03-14: 650 mg via ORAL

## 2017-03-14 MED ORDER — IBUPROFEN 400 MG PO TABS
600.0000 mg | ORAL_TABLET | Freq: Once | ORAL | Status: AC
  Administered 2017-03-14: 600 mg via ORAL
  Filled 2017-03-14: qty 1

## 2017-03-14 MED ORDER — SODIUM CHLORIDE 0.9 % IV BOLUS (SEPSIS)
2000.0000 mL | Freq: Once | INTRAVENOUS | Status: AC
  Administered 2017-03-14: 2000 mL via INTRAVENOUS

## 2017-03-14 MED ORDER — SODIUM CHLORIDE 0.9 % IV BOLUS (SEPSIS)
1000.0000 mL | Freq: Once | INTRAVENOUS | Status: AC
  Administered 2017-03-14: 1000 mL via INTRAVENOUS

## 2017-03-14 NOTE — ED Notes (Signed)
Dr. Oni at bedside at this time.  

## 2017-03-14 NOTE — ED Triage Notes (Signed)
Sore throat/ genrealized body aches onset yesterday. Fever at home. Productive cough of greenish colored sputum. Fever of 102.9 at triage and HR 136.

## 2017-03-14 NOTE — ED Notes (Signed)
Phlebotomy at bedside at this time.

## 2017-03-14 NOTE — ED Notes (Signed)
Patient transported to X-ray 

## 2017-03-14 NOTE — ED Provider Notes (Signed)
[facility]-EMERGENCY DEPT Provider Note   CSN: [id number] Arrival date & time: 03/14/17  0000  By signing my name below, I, Lauren Choi, attest that this documentation has been prepared under the direction and in the presence of [name], MD . Electronically Signed: Nelwyn Choi, Scribe. 03/14/2017. 1:00 AM.  History   Chief Complaint Chief Complaint  Patient presents with  . Sore Throat  . Generalized Body Aches   The history is provided by the patient. No language interpreter was used.    HPI Comments:  Lauren Choi is a 51 y.o. female with pmhx of Fibromyalgia and Sarcoidosis who presents to the Emergency Department complaining constant, mild, diffuse myalgias onset yesterday. Pt reports associated light-headedness, headache, fever (Tmax 103), SOB, cough productive of green sputum and sore throat. She was given Tylenol here in the ED which did not give her any relief. Denies any cough. Pt notes that some of her family members have been sick with similar sx.   Past Medical History:  Diagnosis Date  . Back pain   . Fibromyalgia   . Sarcoidosis (HCC)     There are no active problems to display for this patient.   Past Surgical History:  Procedure Laterality Date  . ABDOMINAL HYSTERECTOMY    . APPENDECTOMY    . CHOLECYSTECTOMY      OB History    No data available       Home Medications    Prior to Admission medications   Medication Sig Start Date End Date Taking? Authorizing Provider  acetaminophen (TYLENOL) 500 MG tablet Take 500 mg by mouth every 4 (four) hours as needed for mild pain or moderate pain.     Historical Provider, MD  amitriptyline (ELAVIL) 50 MG tablet Take 50 mg by mouth at bedtime. 09/22/14   Historical Provider, MD  diphenhydrAMINE (BENADRYL) 25 mg capsule Take 25 mg by mouth every 6 (six) hours as needed for allergies.    Historical Provider, MD  gabapentin (NEURONTIN) 100 MG capsule Take 100 mg by mouth. 05/13/15 05/23/15  Historical Provider, MD   GARCINIA CAMBOGIA-CHROMIUM PO Take 2 capsules by mouth daily.    Historical Provider, MD  HYDROcodone-acetaminophen (NORCO) 5-325 MG tablet Take 1 tablet by mouth every 6 (six) hours as needed for moderate pain. 11/12/16   Tatyana Kirichenko, PA-C  ibuprofen (ADVIL,MOTRIN) 600 MG tablet Take 1 tablet (600 mg total) by mouth every 6 (six) hours as needed. 11/12/16   Tatyana Kirichenko, PA-C  meloxicam (MOBIC) 15 MG tablet Take 1 tablet (15 mg total) by mouth daily. 03/18/15   Oswaldo Conroy, PA-C  methocarbamol (ROBAXIN) 500 MG tablet Take 1 tablet (500 mg total) by mouth 2 (two) times daily. Patient not taking: Reported on 03/18/2015 01/21/15   Rolland Porter, MD  methocarbamol (ROBAXIN) 500 MG tablet Take 1 tablet (500 mg total) by mouth 2 (two) times daily. Patient not taking: Reported on 03/18/2015 01/21/15   Rolland Porter, MD  Multiple Vitamins-Minerals (ONE DAILY MULTIVITAMIN WOMEN) TABS Take 1 tablet by mouth daily.    Historical Provider, MD  naproxen (NAPROSYN) 500 MG tablet Take 1 tablet (500 mg total) by mouth 2 (two) times daily. Patient not taking: Reported on 03/18/2015 01/21/15   Rolland Porter, MD  oxyCODONE-acetaminophen (PERCOCET/ROXICET) 5-325 MG per tablet Take 2 tablets by mouth every 4 (four) hours as needed. Patient not taking: Reported on 03/18/2015 01/21/15   Rolland Porter, MD  oxyCODONE-acetaminophen (PERCOCET/ROXICET) 5-325 MG per tablet Take 2 tablets by mouth every 4 (  four) hours as needed. Patient not taking: Reported on 03/18/2015 01/21/15   Rolland Porter, MD  oxymetazoline (AFRIN) 0.05 % nasal spray Place 1 spray into both nostrils 2 (two) times daily as needed for congestion.    Historical Provider, MD    Family History No family history on file.  Social History Social History  Substance Use Topics  . Smoking status: Never Smoker  . Smokeless tobacco: Never Used  . Alcohol use Yes     Comment: ocassionally     Allergies   Lyrica [pregabalin]; Tomato; and Tramadol   Review of  Systems Review of Systems All systems reviewed and are negative for acute change except as noted in the HPI.  Physical Exam Updated Vital Signs BP 115/81   Pulse 94   Temp 98.1 F (36.7 C) (Oral)   Resp 16   Ht  (1.727 m)   Wt 233 lb (105.7 kg)   SpO2 95%   BMI 35.43 kg/m   Physical Exam  Constitutional: She is oriented to person, place, and time. She appears well-developed and well-nourished. She appears distressed.  distressed  HENT:  Head: Normocephalic and atraumatic.  Nose: Nose normal.  Mouth/Throat: Oropharynx is clear and moist. No oropharyngeal exudate.  Eyes: Conjunctivae and EOM are normal. Pupils are equal, round, and reactive to light. No scleral icterus.  Neck: Normal range of motion. Neck supple. No JVD present. No tracheal deviation present. No thyromegaly present.  Cardiovascular: Regular rhythm and normal heart sounds.  Tachycardia present.  Exam reveals no gallop and no friction rub.   No murmur heard. tachycardic  Pulmonary/Chest: Breath sounds normal. Accessory muscle usage present. Tachypnea noted. She is in respiratory distress. She has no wheezes. She exhibits no tenderness.  Increased work of breathing.  Abdominal: Soft. Bowel sounds are normal. She exhibits no distension and no mass. There is no tenderness. There is no rebound and no guarding.  Musculoskeletal: Normal range of motion. She exhibits no edema or tenderness.  Lymphadenopathy:    She has no cervical adenopathy.  Neurological: She is alert and oriented to person, place, and time. No cranial nerve deficit. She exhibits normal muscle tone.  Skin: Skin is warm and dry. No rash noted. No erythema. No pallor.  Nursing note and vitals reviewed.    ED Treatments / Results  DIAGNOSTIC STUDIES:  Oxygen Saturation is 96% on RA, normal by my interpretation.    COORDINATION OF CARE:  1:09 AM Discussed treatment plan with pt at bedside which includes X-ray imaging and lab work and pt agreed  to plan.  Labs (all labs ordered are listed, but only abnormal results are displayed) Labs Reviewed  COMPREHENSIVE METABOLIC PANEL - Abnormal; Notable for the following:       Result Value   CO2 20 (*)    Glucose, Bld 148 (*)    Creatinine, Ser 1.31 (*)    AST 44 (*)    GFR calc non Af Amer 46 (*)    GFR calc Af Amer 54 (*)    All other components within normal limits  URINALYSIS, ROUTINE W REFLEX MICROSCOPIC - Abnormal; Notable for the following:    Hgb urine dipstick SMALL (*)    Squamous Epithelial / LPF 0-5 (*)    All other components within normal limits  I-STAT CG4 LACTIC ACID, ED - Abnormal; Notable for the following:    Lactic Acid, Venous 2.45 (*)    All other components within normal limits  CULTURE, BLOOD (ROUTINE X  2)  CULTURE, BLOOD (ROUTINE X 2)  URINE CULTURE  CBC WITH DIFFERENTIAL/PLATELET  PROTIME-INR  INFLUENZA PANEL BY PCR (TYPE A & B)  I-STAT CG4 LACTIC ACID, ED  I-STAT CG4 LACTIC ACID, ED    EKG  EKG Interpretation  Date/Time:  Tuesday March 14 2017 00:23:40 EDT Ventricular Rate:  136 PR Interval:    QRS Duration: 90 QT Interval:  376 QTC Calculation: 565 R Axis:   79 Text Interpretation:   Critical Test Result: Long QTc Sinus tachycardia with short PR Nonspecific T wave abnormality Abnormal ECG PQT is new Confirmed by Erroll Luna [phone]) on 03/14/2017 12:58:59 AM       Radiology Dg Chest 2 View  Result Date: 03/14/2017 CLINICAL DATA:  Initial evaluation for acute fever, chest pain, weakness. EXAM: CHEST  2 VIEW COMPARISON:  Prior CT from 01/21/2015. FINDINGS: The cardiac and mediastinal silhouettes are stable in size and contour, and remain within normal limits. The lungs are normally inflated. No airspace consolidation, pleural effusion, or pulmonary edema is identified. There is no pneumothorax. No acute osseous abnormality identified. IMPRESSION: No radiographic evidence for active cardiopulmonary disease. Electronically Signed   By:  Rise Mu M.D.   On: 03/14/2017 01:10    Procedures Procedures (including critical care time)  Medications Ordered in ED Medications  acetaminophen (TYLENOL) tablet 650 mg (650 mg Oral Given 03/14/17 0037)  acetaminophen (TYLENOL) tablet 325 mg (325 mg Oral Given 03/14/17 0123)  sodium chloride 0.9 % bolus 2,000 mL (0 mLs Intravenous Stopped 03/14/17 0332)  ketorolac (TORADOL) 30 MG/ML injection 30 mg (30 mg Intravenous Given 03/14/17 0130)  ibuprofen (ADVIL,MOTRIN) tablet 600 mg (600 mg Oral Given 03/14/17 0331)  sodium chloride 0.9 % bolus 1,000 mL (1,000 mLs Intravenous New Bag/Given 03/14/17 0454)     Initial Impression / Assessment and Plan / ED Course  I have reviewed the triage vital signs and the nursing notes.  Pertinent labs & imaging results that were available during my care of the patient were reviewed by me and considered in my medical decision making (see chart for details).     Patient presents to the ED URI symptoms with body aches.  She has had the flu twice per year per the patient.  She wsas recently exposed again to the flu with a family member.  CXR and UA are netgative for infection.  She was initially febrile and tachycardic.  Code sepsis was called.  After tylenol, ibuprofe, and IVF, her VS have normalized.  No source was found.  Flu test pending. Will continue to observe in the ED. Currently she has normal VS.  6:18 AM VS have remained normal.  She continues to appear well and in NAD.  Patient is safe for DC with PCP fu within 3 days.  CRITICAL CARE Performed by: [name]   Total critical care time: 45 minutes - sepsis  Critical care time was exclusive of separately billable procedures and treating other patients.  Critical care was necessary to treat or prevent imminent or life-threatening deterioration.  Critical care was time spent personally by me on the following activities: development of treatment plan with patient and/or surrogate as  well as nursing, discussions with consultants, evaluation of patient's response to treatment, examination of patient, obtaining history from patient or surrogate, ordering and performing treatments and interventions, ordering and review of laboratory studies, ordering and review of radiographic studies, pulse oximetry and re-evaluation of patient's condition.   Final Clinical Impressions(s) / ED Diagnoses  Final diagnoses:  Viral syndrome    New Prescriptions New Prescriptions   No medications on file     I personally performed the services described in this documentation, which was scribed in my presence. The recorded information has been reviewed and is accurate.       [name], MD 03/14/17 [phone]

## 2017-03-15 LAB — URINE CULTURE: Culture: <10000 — AB

## 2017-03-19 LAB — CULTURE, BLOOD (ROUTINE X 2)
Culture: NO GROWTH
Culture: NO GROWTH
SPECIAL REQUESTS: ADEQUATE
Special Requests: ADEQUATE

## 2017-06-15 ENCOUNTER — Encounter (HOSPITAL_COMMUNITY): Payer: Self-pay | Admitting: Emergency Medicine

## 2017-06-15 DIAGNOSIS — R1084 Generalized abdominal pain: Secondary | ICD-10-CM | POA: Diagnosis not present

## 2017-06-15 DIAGNOSIS — M797 Fibromyalgia: Secondary | ICD-10-CM | POA: Insufficient documentation

## 2017-06-15 DIAGNOSIS — R350 Frequency of micturition: Secondary | ICD-10-CM | POA: Insufficient documentation

## 2017-06-15 DIAGNOSIS — Z79899 Other long term (current) drug therapy: Secondary | ICD-10-CM | POA: Diagnosis not present

## 2017-06-15 DIAGNOSIS — G8929 Other chronic pain: Secondary | ICD-10-CM | POA: Diagnosis not present

## 2017-06-15 DIAGNOSIS — M545 Low back pain: Secondary | ICD-10-CM | POA: Diagnosis present

## 2017-06-15 NOTE — ED Notes (Signed)
Pt from home with c/o lower back pain x 1 week with increase in urinary frequency. Pt also has new onset tingling in bilateral feet that started about 1 week ago. Pt has hx of back pain and fibromyalgia

## 2017-06-16 ENCOUNTER — Emergency Department (HOSPITAL_COMMUNITY)
Admission: EM | Admit: 2017-06-16 | Discharge: 2017-06-16 | Disposition: A | Discharge status: Home / Self Care | Payer: Medicaid Other | Attending: Emergency Medicine | Admitting: Emergency Medicine

## 2017-06-16 DIAGNOSIS — R1084 Generalized abdominal pain: Secondary | ICD-10-CM

## 2017-06-16 DIAGNOSIS — M545 Low back pain, unspecified: Secondary | ICD-10-CM

## 2017-06-16 LAB — URINALYSIS, ROUTINE W REFLEX MICROSCOPIC
Bilirubin Urine: NEGATIVE
GLUCOSE, UA: NEGATIVE mg/dL
HGB URINE DIPSTICK: NEGATIVE
KETONES UR: NEGATIVE mg/dL
LEUKOCYTES UA: NEGATIVE
Nitrite: NEGATIVE
PH: 5 (ref 5.0–8.0)
Protein, ur: NEGATIVE mg/dL
Specific Gravity, Urine: 1.024 (ref 1.005–1.030)

## 2017-06-16 LAB — CBC WITH DIFFERENTIAL/PLATELET
BASOS ABS: 0 10*3/uL (ref 0.0–0.1)
Basophils Relative: 0 %
EOS ABS: 0.3 10*3/uL (ref 0.0–0.7)
EOS PCT: 4 %
HCT: 40.5 % (ref 36.0–46.0)
HEMOGLOBIN: 13.4 g/dL (ref 12.0–15.0)
LYMPHS PCT: 41 %
Lymphs Abs: 2.8 10*3/uL (ref 0.7–4.0)
MCH: 29.3 pg (ref 26.0–34.0)
MCHC: 33.1 g/dL (ref 30.0–36.0)
MCV: 88.4 fL (ref 78.0–100.0)
Monocytes Absolute: 0.4 10*3/uL (ref 0.1–1.0)
Monocytes Relative: 5 %
NEUTROS PCT: 50 %
Neutro Abs: 3.3 10*3/uL (ref 1.7–7.7)
PLATELETS: 310 10*3/uL (ref 150–400)
RBC: 4.58 MIL/uL (ref 3.87–5.11)
RDW: 12.4 % (ref 11.5–15.5)
WBC: 6.8 10*3/uL (ref 4.0–10.5)

## 2017-06-16 LAB — COMPREHENSIVE METABOLIC PANEL
ALT: 30 U/L (ref 14–54)
AST: 33 U/L (ref 15–41)
Albumin: 4.1 g/dL (ref 3.5–5.0)
Alkaline Phosphatase: 70 U/L (ref 38–126)
Anion gap: 12 (ref 5–15)
BILIRUBIN TOTAL: 0.4 mg/dL (ref 0.3–1.2)
BUN: 13 mg/dL (ref 6–20)
CO2: 23 mmol/L (ref 22–32)
CREATININE: 0.95 mg/dL (ref 0.44–1.00)
Calcium: 9.4 mg/dL (ref 8.9–10.3)
Chloride: 105 mmol/L (ref 101–111)
Glucose, Bld: 126 mg/dL — ABNORMAL HIGH (ref 65–99)
POTASSIUM: 3.2 mmol/L — AB (ref 3.5–5.1)
Sodium: 140 mmol/L (ref 135–145)
TOTAL PROTEIN: 7.9 g/dL (ref 6.5–8.1)

## 2017-06-16 LAB — LIPASE, BLOOD: LIPASE: 18 U/L (ref 11–51)

## 2017-06-16 MED ORDER — KETOROLAC TROMETHAMINE 15 MG/ML IJ SOLN
15.0000 mg | Freq: Once | INTRAMUSCULAR | Status: AC
  Administered 2017-06-16: 15 mg via INTRAVENOUS
  Filled 2017-06-16: qty 1

## 2017-06-16 MED ORDER — SODIUM CHLORIDE 0.9 % IV BOLUS (SEPSIS)
1000.0000 mL | Freq: Once | INTRAVENOUS | Status: AC
  Administered 2017-06-16: 1000 mL via INTRAVENOUS

## 2017-06-16 MED ORDER — HYDROCODONE-ACETAMINOPHEN 5-325 MG PO TABS
1.0000 | ORAL_TABLET | Freq: Once | ORAL | Status: AC
Start: 2017-06-16 — End: 2017-06-16
  Administered 2017-06-16: 1 via ORAL
  Filled 2017-06-16: qty 1

## 2017-06-16 MED ORDER — FENTANYL CITRATE (PF) 100 MCG/2ML IJ SOLN
100.0000 ug | Freq: Once | INTRAMUSCULAR | Status: AC
  Administered 2017-06-16: 100 ug via INTRAVENOUS
  Filled 2017-06-16: qty 2

## 2017-06-16 NOTE — ED Notes (Signed)
IN AND OUT CATH DONE BY RN Warden FillersLIZA AND NT MEGAN, 150 ML IN BLADDER, DARK YELLOW URINE.  MD Orland MustardWICKLINE AWARE.

## 2017-06-16 NOTE — ED Provider Notes (Signed)
[facility]-EMERGENCY DEPT Provider Note   CSN: [id number] Arrival date & time: 06/15/17  1913  By signing my name below, I, Ny'Kea Lewis, attest that this documentation has been prepared under the direction and in the presence of Zadie RhineWickline, Yeriel Mineo, MD. Electronically Signed: Karren CobbleNy'Kea Lewis, ED Scribe. 06/16/17. 12:40 AM.  History   Chief Complaint Chief Complaint  Patient presents with  . Back Pain  . Urinary Frequency    The history is provided by the patient. No language interpreter was used.  Back Pain   This is a chronic problem. The current episode started more than 2 days ago. The problem occurs every several days. The problem has been gradually worsening. The pain is associated with no known injury. The pain is present in the lumbar spine. The pain radiates to the left thigh and right thigh. The symptoms are aggravated by bending, twisting and certain positions. The pain is the same all the time. Associated symptoms include abdominal pain. Pertinent negatives include no chest pain, no fever, no bowel incontinence, no bladder incontinence and no dysuria. Treatments tried: home meds. The treatment provided no relief.  Urinary Frequency  Associated symptoms include abdominal pain. Pertinent negatives include no chest pain.   HPI Comments: Lauren Choi is a 51 y.o. female with a history of chronic back pain, fibromyalgia, and sarcoidosis,  who presents to the Emergency Department complaining of gradually worsening, persistent acute on chronic lower back pain that worsened one week ago.  Pt notes associated lower quadrant abdominal pain and urinary frequency that began three days ago. She reports she had a nerve block two weeks ago and she has been experiencing pain when she sit and lays down. Pt is followed by pain management for her chronic back pain. She reports her currently medication have not been resolving her pain. Denies fever, vomiting, diarrhea, bowel incontinence, chest pain, vaginal  bleeding, or vaginal discharge.  Past Medical History:  Diagnosis Date  . Back pain   . Fibromyalgia   . Sarcoidosis    There are no active problems to display for this patient.  Past Surgical History:  Procedure Laterality Date  . ABDOMINAL HYSTERECTOMY    . APPENDECTOMY    . CHOLECYSTECTOMY     OB History    No data available     Home Medications    Prior to Admission medications   Medication Sig Start Date End Date Taking? Authorizing Provider  acetaminophen (TYLENOL) 500 MG tablet Take 500 mg by mouth every 4 (four) hours as needed for mild pain or moderate pain.     [provider]  amitriptyline (ELAVIL) 50 MG tablet Take 50 mg by mouth at bedtime. 09/22/14   [provider]  diphenhydrAMINE (BENADRYL) 25 mg capsule Take 25 mg by mouth every 6 (six) hours as needed for allergies.    [provider]  gabapentin (NEURONTIN) 100 MG capsule Take 100 mg by mouth. 05/13/15 05/23/15  [provider]  GARCINIA CAMBOGIA-CHROMIUM PO Take 2 capsules by mouth daily.    [provider]  HYDROcodone-acetaminophen (NORCO) 5-325 MG tablet Take 1 tablet by mouth every 6 (six) hours as needed for moderate pain. 11/12/16   Kirichenko, Lemont Fillersatyana, PA-C  ibuprofen (ADVIL,MOTRIN) 600 MG tablet Take 1 tablet (600 mg total) by mouth every 6 (six) hours as needed. 11/12/16   Kirichenko, Lemont Fillersatyana, PA-C  meloxicam (MOBIC) 15 MG tablet Take 1 tablet (15 mg total) by mouth daily. 03/18/15   Oswaldo Conroyreech, Victoria, PA-C  methocarbamol (ROBAXIN) 500  MG tablet Take 1 tablet (500 mg total) by mouth 2 (two) times daily. Patient not taking: Reported on 03/18/2015 01/21/15   Rolland Porter, MD  methocarbamol (ROBAXIN) 500 MG tablet Take 1 tablet (500 mg total) by mouth 2 (two) times daily. Patient not taking: Reported on 03/18/2015 01/21/15   Rolland Porter, MD  Multiple Vitamins-Minerals (ONE DAILY MULTIVITAMIN WOMEN) TABS Take 1 tablet by mouth daily.    [provider]    naproxen (NAPROSYN) 500 MG tablet Take 1 tablet (500 mg total) by mouth 2 (two) times daily. Patient not taking: Reported on 03/18/2015 01/21/15   Rolland Porter, MD  oxyCODONE-acetaminophen (PERCOCET/ROXICET) 5-325 MG per tablet Take 2 tablets by mouth every 4 (four) hours as needed. Patient not taking: Reported on 03/18/2015 01/21/15   Rolland Porter, MD  oxyCODONE-acetaminophen (PERCOCET/ROXICET) 5-325 MG per tablet Take 2 tablets by mouth every 4 (four) hours as needed. Patient not taking: Reported on 03/18/2015 01/21/15   Rolland Porter, MD  oxymetazoline (AFRIN) 0.05 % nasal spray Place 1 spray into both nostrils 2 (two) times daily as needed for congestion.    [provider]   Family History No family history on file.  Social History Social History  Substance Use Topics  . Smoking status: Never Smoker  . Smokeless tobacco: Never Used  . Alcohol use Yes     Comment: ocassionally   Allergies   Lyrica [pregabalin]; Tomato; and Tramadol  Review of Systems Review of Systems  Constitutional: Negative for fever.  Cardiovascular: Negative for chest pain.  Gastrointestinal: Positive for abdominal pain. Negative for bowel incontinence, diarrhea, nausea and vomiting.  Genitourinary: Positive for frequency. Negative for bladder incontinence, dysuria, vaginal bleeding and vaginal discharge.  Musculoskeletal: Positive for back pain.  All other systems reviewed and are negative.  Physical Exam Updated Vital Signs BP (!) 111/95 (BP Location: Left Arm)   Pulse 99   Temp 98.1 F (36.7 C) (Oral)   Resp 18   SpO2 99%   Physical Exam CONSTITUTIONAL: Well developed/well nourished HEAD: Normocephalic/atraumatic EYES: EOMI/PERRL ENMT: Mucous membranes moist NECK: supple no meningeal signs SPINE/BACK: lumbar spinal tenderness, No bruising/crepitance/stepoffs noted to spine CV: S1/S2 noted, no murmurs/rubs/gallops noted LUNGS: Lungs are clear to auscultation bilaterally, no apparent  distress ABDOMEN: soft, mild diffuse lower abdominal tenderness, no rebound or guarding, bowel sounds noted throughout abdomen GU:no cva tenderness NEURO: Pt is awake/alert/appropriate, moves all extremitiesx4.  No facial droop. Equal strength noted bilateral lower extremities.  EXTREMITIES: pulses normal/equal, full ROM SKIN: warm, color normal PSYCH: no abnormalities of mood noted, alert and oriented to situation  ED Treatments / Results  DIAGNOSTIC STUDIES: Oxygen Saturation is 99% on RA, normal by my interpretation.   COORDINATION OF CARE: 12:23 AM-Discussed next steps with pt. Pt verbalized understanding and is agreeable with the plan.   Labs (all labs ordered are listed, but only abnormal results are displayed) Labs Reviewed  COMPREHENSIVE METABOLIC PANEL - Abnormal; Notable for the following:       Result Value   Potassium 3.2 (*)    Glucose, Bld 126 (*)    All other components within normal limits  URINALYSIS, ROUTINE W REFLEX MICROSCOPIC  CBC WITH DIFFERENTIAL/PLATELET  LIPASE, BLOOD    EKG  EKG Interpretation None      Radiology No results found.  Procedures Procedures   Medications Ordered in ED Medications  HYDROcodone-acetaminophen (NORCO/VICODIN) 5-325 MG per tablet 1 tablet (not administered)  fentaNYL (SUBLIMAZE) injection 100 mcg (100 mcg Intravenous Given 06/16/17 0054)  ketorolac (TORADOL) 15 MG/ML injection 15 mg (15 mg Intravenous Given 06/16/17 0218)  sodium chloride 0.9 % bolus 1,000 mL (1,000 mLs Intravenous New Bag/Given 06/16/17 0411)     Initial Impression / Assessment and Plan / ED Course  I have reviewed the triage vital signs and the nursing notes.  Pertinent labs results that were available during my care of the patient were reviewed by me and considered in my medical decision making (see chart for details).     4:27 AM Pt in the ED For multiple complaints For her BP - this is acute on chronic.  She reports symptoms not improved  after recent lumbar spinal injection No warmth/erythema or fever to suggest infectious etiology She is ambulatory.  No incontinence.  No signs of cauda equina She reports she will not violate pain contract if she has ED pain meds  For abd pain - this resolved.  No focal tenderness on repeat exam Labs reassuring  I feel she is safe/appropriate for d/c home   Final Clinical Impressions(s) / ED Diagnoses   Final diagnoses:  Acute midline low back pain without sciatica  Generalized abdominal pain    New Prescriptions New Prescriptions   No medications on file  I personally performed the services described in this documentation, which was scribed in my presence. The recorded information has been reviewed and is accurate.        Zadie Rhine, MD 06/16/17 0430

## 2017-06-16 NOTE — ED Notes (Signed)
PT able to ambulate in hall with no assist. C/o foot and lower back pain

## 2017-06-16 NOTE — ED Notes (Signed)
Pt attempted to give urine sample, but was unable to.  

## 2017-06-16 NOTE — Discharge Instructions (Signed)

## 2017-12-25 ENCOUNTER — Emergency Department (HOSPITAL_COMMUNITY)
Admission: EM | Admit: 2017-12-25 | Discharge: 2017-12-26 | Disposition: A | Discharge status: Home / Self Care | Payer: Medicare Other | Attending: Emergency Medicine | Admitting: Emergency Medicine

## 2017-12-25 ENCOUNTER — Encounter (HOSPITAL_COMMUNITY): Payer: Self-pay | Admitting: Emergency Medicine

## 2017-12-25 DIAGNOSIS — R0789 Other chest pain: Secondary | ICD-10-CM | POA: Insufficient documentation

## 2017-12-25 DIAGNOSIS — Z79899 Other long term (current) drug therapy: Secondary | ICD-10-CM | POA: Diagnosis not present

## 2017-12-25 LAB — CBC
HEMATOCRIT: 39.5 % (ref 36.0–46.0)
HEMOGLOBIN: 13.6 g/dL (ref 12.0–15.0)
MCH: 30.4 pg (ref 26.0–34.0)
MCHC: 34.4 g/dL (ref 30.0–36.0)
MCV: 88.4 fL (ref 78.0–100.0)
Platelets: 337 10*3/uL (ref 150–400)
RBC: 4.47 MIL/uL (ref 3.87–5.11)
RDW: 12.1 % (ref 11.5–15.5)
WBC: 9.5 10*3/uL (ref 4.0–10.5)

## 2017-12-25 LAB — BASIC METABOLIC PANEL
ANION GAP: 13 (ref 5–15)
BUN: 10 mg/dL (ref 6–20)
CO2: 22 mmol/L (ref 22–32)
Calcium: 9.6 mg/dL (ref 8.9–10.3)
Chloride: 104 mmol/L (ref 101–111)
Creatinine, Ser: 0.96 mg/dL (ref 0.44–1.00)
GFR calc Af Amer: >60 mL/min (ref >60)
GFR calc non Af Amer: >60 mL/min (ref >60)
GLUCOSE: 181 mg/dL — AB (ref 65–99)
POTASSIUM: 4.6 mmol/L (ref 3.5–5.1)
Sodium: 139 mmol/L (ref 135–145)

## 2017-12-25 LAB — I-STAT TROPONIN, ED: Troponin i, poc: 0 ng/mL (ref 0.00–0.08)

## 2017-12-25 LAB — I-STAT BETA HCG BLOOD, ED (MC, WL, AP ONLY): I-stat hCG, quantitative: 5.9 m[IU]/mL — ABNORMAL HIGH (ref <5)

## 2017-12-25 NOTE — ED Triage Notes (Signed)
Pt arrives via EMS with complaints of substernal chest pain that started around 5 pm. Pt reports having fibromyalgia and started on Savella, states she believes the chest pain is related. EMS gave 324 ASA and 3 nitro with no relief.

## 2017-12-25 NOTE — ED Provider Notes (Signed)
[facility] EMERGENCY DEPARTMENT Provider Note   CSN: [id number] Arrival date & time: 12/25/17  2243     History   Chief Complaint Chief Complaint  Patient presents with  . Chest Pain    HPI GOLDA ZAVALZA is a 52 y.o. female.  Patient presents to the emergency department for evaluation of chest pain.  Symptoms began around 6 PM.  Patient was not exerting herself when pain began.  It is a tightness in the center of her chest.  She initially felt slightly short of breath, does not feel short of breath currently.  She was administered nitroglycerin without any change in her pain.  She does not have any known cardiac disease.  Patient reports that the symptoms were associated with palpitations and racing heartbeat.  She thinks that it might be secondary to starting [facility] for her fibromyalgia 3 days ago.      Past Medical History:  Diagnosis Date  . Back pain   . Fibromyalgia   . Sarcoidosis     There are no active problems to display for this patient.   Past Surgical History:  Procedure Laterality Date  . ABDOMINAL HYSTERECTOMY    . APPENDECTOMY    . CHOLECYSTECTOMY      OB History    No data available       Home Medications    Prior to Admission medications   Medication Sig Start Date End Date Taking? Authorizing Provider  amitriptyline (ELAVIL) 50 MG tablet Take 50 mg by mouth at bedtime. 09/22/14  Yes [provider]  Milnacipran HCl 12.5 & 25 & 50 MG MISC Take 12.5-50 mg by mouth See admin instructions. Take 12.5 mg daily on 1/23 then take 12.5 mg twice daily on 1/24 and 1/25 then take 25 mg twice daily on 1/26, 1/27, 1/28 and 1/29 then take 50 mg twice daily 12/20/17  Yes [provider]  Multiple Vitamins-Minerals (ONE DAILY MULTIVITAMIN WOMEN) TABS Take 1 tablet by mouth daily.   Yes [provider]  tiZANidine (ZANAFLEX) 2 MG tablet Take 2 mg by mouth every 8 (eight) hours as needed for muscle spasms (for 10  days).  12/21/17  Yes [provider]  oxyCODONE-acetaminophen (PERCOCET) 5-325 MG tablet Take 1 tablet by mouth every 4 (four) hours as needed. 12/26/17   Gilda Crease, MD    Family History No family history on file.  Social History Social History   Tobacco Use  . Smoking status: Never Smoker  . Smokeless tobacco: Never Used  Substance Use Topics  . Alcohol use: Yes    Comment: ocassionally  . Drug use: No     Allergies   Lyrica [pregabalin]; Tomato; and Tramadol   Review of Systems Review of Systems  Respiratory: Positive for shortness of breath.   Cardiovascular: Positive for chest pain and palpitations.  All other systems reviewed and are negative.    Physical Exam Updated Vital Signs BP (!) 137/104   Pulse 98   Resp 14   SpO2 97%   Physical Exam  Constitutional: She is oriented to person, place, and time. She appears well-developed and well-nourished. No distress.  HENT:  Head: Normocephalic and atraumatic.  Right Ear: Hearing normal.  Left Ear: Hearing normal.  Nose: Nose normal.  Mouth/Throat: Oropharynx is clear and moist and mucous membranes are normal.  Eyes: Conjunctivae and EOM are normal. Pupils are equal, round, and reactive to light.  Neck: Normal range of motion. Neck supple.  Cardiovascular:  Regular rhythm, S1 normal and S2 normal. Exam reveals no gallop and no friction rub.  No murmur heard. Pulmonary/Chest: Effort normal and breath sounds normal. No respiratory distress. She exhibits tenderness.    Abdominal: Soft. Normal appearance and bowel sounds are normal. There is no hepatosplenomegaly. There is no tenderness. There is no rebound, no guarding, no tenderness at McBurney's point and negative Murphy's sign. No hernia.  Musculoskeletal: Normal range of motion.  Neurological: She is alert and oriented to person, place, and time. She has normal strength. No cranial nerve deficit or sensory deficit. Coordination normal. GCS  eye subscore is 4. GCS verbal subscore is 5. GCS motor subscore is 6.  Skin: Skin is warm, dry and intact. No rash noted. No cyanosis.  Psychiatric: She has a normal mood and affect. Her speech is normal and behavior is normal. Thought content normal.  Nursing note and vitals reviewed.    ED Treatments / Results  Labs (all labs ordered are listed, but only abnormal results are displayed) Labs Reviewed  BASIC METABOLIC PANEL - Abnormal; Notable for the following components:      Result Value   Glucose, Bld 181 (*)    All other components within normal limits  HCG, QUANTITATIVE, PREGNANCY - Abnormal; Notable for the following components:   hCG, Beta Chain, Quant, S 6 (*)    All other components within normal limits  I-STAT BETA HCG BLOOD, ED ([facility], [facility], [facility] ONLY) - Abnormal; Notable for the following components:   I-stat hCG, quantitative 5.9 (*)    All other components within normal limits  CBC  I-STAT TROPONIN, ED  I-STAT TROPONIN, ED    EKG  EKG Interpretation  Date/Time:  Monday December 25 2017 22:53:26 EST Ventricular Rate:  125 PR Interval:    QRS Duration: 92 QT Interval:  416 QTC Calculation: 600 R Axis:   58 Text Interpretation:  Critical Test Result: Long QTc Sinus tachycardia with short PR Otherwise normal ECG Confirmed by Gilda Crease [phone]) on 12/25/2017 11:30:44 PM       Radiology Dg Chest 2 View  Result Date: 12/26/2017 CLINICAL DATA:  Chest pain. Recent change in medications causes rapid heart beat and chest pain. EXAM: CHEST  2 VIEW COMPARISON:  03/14/2017 FINDINGS: The heart size and mediastinal contours are within normal limits. Both lungs are clear. The visualized skeletal structures are unremarkable. Surgical clips in the right upper quadrant. IMPRESSION: No active cardiopulmonary disease. Electronically Signed   By: Burman Nieves M.D.   On: 12/26/2017 00:46   Ct Angio Chest Pe W Or Wo Contrast  Result Date: 12/26/2017 CLINICAL DATA:   Right-sided chest pain and shortness of breath starting this evening. EXAM: CT ANGIOGRAPHY CHEST WITH CONTRAST TECHNIQUE: Multidetector CT imaging of the chest was performed using the standard protocol during bolus administration of intravenous contrast. Multiplanar CT image reconstructions and MIPs were obtained to evaluate the vascular anatomy. CONTRAST:  ISOVUE-370 IOPAMIDOL (ISOVUE-370) INJECTION 76% COMPARISON:  01/21/2015 FINDINGS: Cardiovascular: Good opacification of the central and segmental pulmonary arteries. No focal filling defects demonstrated. No evidence of significant pulmonary embolus. Normal caliber thoracic aorta. No aortic dissection. Great vessel origins are patent. Mild cardiac enlargement. No pericardial effusion. Mediastinum/Nodes: No enlarged mediastinal, hilar, or axillary lymph nodes. Thyroid gland, trachea, and esophagus demonstrate no significant findings. Lungs/Pleura: Motion artifact limits evaluation of the lungs. Dependent atelectasis in the lung bases. No focal consolidation or airspace disease is suggested. No pleural effusions. No pneumothorax. Airways are patent. Upper  Abdomen: Surgical absence of the gallbladder. No acute process identified. Musculoskeletal: No chest wall abnormality. No acute or significant osseous findings. Review of the MIP images confirms the above findings. IMPRESSION: 1. No evidence of significant pulmonary embolus. 2. No evidence of active pulmonary disease. 3. Mild cardiac enlargement. Electronically Signed   By: Burman NievesWilliam  Stevens M.D.   On: 12/26/2017 02:06    Procedures Procedures (including critical care time)  Medications Ordered in ED Medications  iopamidol (ISOVUE-370) 76 % injection (100 mLs  Contrast Given 12/26/17 0015)  HYDROmorphone (DILAUDID) injection 1 mg (1 mg Intravenous Given 12/26/17 0124)  ondansetron (ZOFRAN) injection 4 mg (4 mg Intravenous Given 12/26/17 0124)     Initial Impression / Assessment and Plan / ED Course   I have reviewed the triage vital signs and the nursing notes.  Pertinent labs & imaging results that were available during my care of the patient were reviewed by me and considered in my medical decision making (see chart for details).     Patient presents to the emergency department for evaluation of chest pain.  Patient experiencing central chest pain that started while at rest earlier today.  She received nitroglycerin and had no change in her pain.  Pain is reproducible, the area is very tender to the touch.  She did have improvement with analgesia.  Her EKG does not show any change from previous.  First troponin was negative.  Patient was noted to be tachycardic.  This is likely secondary to pain, but I could not rule out PE.  Patient therefore underwent CT angiography and this was negative.  A repeat troponin was still negative.  Patient has a history of fibromyalgia, this is likely the cause of the chest wall pain.  [facility]  This is likely causing some of her symptoms of dizziness and palpitations.  She was told that she could continue the medication, however, she might want to hold it and talk to her prescriber about other options.  Final Clinical Impressions(s) / ED Diagnoses   Final diagnoses:  Chest wall pain    ED Discharge Orders        Ordered    oxyCODONE-acetaminophen (PERCOCET) 5-325 MG tablet  Every 4 hours PRN     12/26/17 0257       Gilda CreasePollina, Takeru Bose J, MD 12/26/17 [phone]

## 2017-12-26 ENCOUNTER — Emergency Department (HOSPITAL_COMMUNITY): Payer: Medicare Other

## 2017-12-26 LAB — HCG, QUANTITATIVE, PREGNANCY: hCG, Beta Chain, Quant, S: 6 m[IU]/mL — ABNORMAL HIGH (ref <5)

## 2017-12-26 LAB — I-STAT TROPONIN, ED: TROPONIN I, POC: 0 ng/mL (ref 0.00–0.08)

## 2017-12-26 MED ORDER — HYDROMORPHONE HCL 1 MG/ML IJ SOLN
1.0000 mg | Freq: Once | INTRAMUSCULAR | Status: AC
  Administered 2017-12-26: 1 mg via INTRAVENOUS
  Filled 2017-12-26: qty 1

## 2017-12-26 MED ORDER — OXYCODONE-ACETAMINOPHEN 5-325 MG PO TABS
1.0000 | ORAL_TABLET | Freq: Once | ORAL | Status: AC
  Administered 2017-12-26: 1 via ORAL
  Filled 2017-12-26: qty 1

## 2017-12-26 MED ORDER — IOPAMIDOL (ISOVUE-370) INJECTION 76%
INTRAVENOUS | Status: AC
  Administered 2017-12-26: 100 mL
  Filled 2017-12-26: qty 100

## 2017-12-26 MED ORDER — ONDANSETRON HCL 4 MG/2ML IJ SOLN
4.0000 mg | Freq: Once | INTRAMUSCULAR | Status: AC
  Administered 2017-12-26: 4 mg via INTRAVENOUS
  Filled 2017-12-26: qty 2

## 2017-12-26 MED ORDER — OXYCODONE-ACETAMINOPHEN 5-325 MG PO TABS: 1.0000 | ORAL_TABLET | ORAL | 0 refills | Status: AC | PRN

## 2017-12-26 NOTE — ED Notes (Signed)
Patient transported to X-ray 

## 2017-12-26 NOTE — ED Notes (Signed)
ED Provider at bedside. 

## 2017-12-26 NOTE — ED Notes (Signed)
Pt verbalizes understanding of d/c instructions. Pt taken to lobby in wheelchair at d/c with all belongings.   

## 2017-12-26 NOTE — ED Notes (Signed)
Patient transported to CT 

## 2019-06-04 ENCOUNTER — Emergency Department (HOSPITAL_COMMUNITY)
Admission: EM | Admit: 2019-06-04 | Discharge: 2019-06-04 | Disposition: A | Discharge status: Home / Self Care | Payer: Medicare Other | Attending: Emergency Medicine | Admitting: Emergency Medicine

## 2019-06-04 ENCOUNTER — Emergency Department (HOSPITAL_COMMUNITY): Payer: Medicare Other

## 2019-06-04 ENCOUNTER — Other Ambulatory Visit: Payer: Self-pay

## 2019-06-04 DIAGNOSIS — J029 Acute pharyngitis, unspecified: Secondary | ICD-10-CM | POA: Diagnosis present

## 2019-06-04 DIAGNOSIS — Z20828 Contact with and (suspected) exposure to other viral communicable diseases: Secondary | ICD-10-CM | POA: Insufficient documentation

## 2019-06-04 DIAGNOSIS — B349 Viral infection, unspecified: Secondary | ICD-10-CM | POA: Insufficient documentation

## 2019-06-04 LAB — CBC
HCT: 43 % (ref 36.0–46.0)
Hemoglobin: 13.6 g/dL (ref 12.0–15.0)
MCH: 29.5 pg (ref 26.0–34.0)
MCHC: 31.6 g/dL (ref 30.0–36.0)
MCV: 93.3 fL (ref 80.0–100.0)
Platelets: 225 10*3/uL (ref 150–400)
RBC: 4.61 MIL/uL (ref 3.87–5.11)
RDW: 11.6 % (ref 11.5–15.5)
WBC: 10.4 10*3/uL (ref 4.0–10.5)
nRBC: 0 % (ref 0.0–0.2)

## 2019-06-04 LAB — COMPREHENSIVE METABOLIC PANEL
ALT: 28 U/L (ref 0–44)
AST: 27 U/L (ref 15–41)
Albumin: 4 g/dL (ref 3.5–5.0)
Alkaline Phosphatase: 92 U/L (ref 38–126)
Anion gap: 11 (ref 5–15)
BUN: 13 mg/dL (ref 6–20)
CO2: 18 mmol/L — ABNORMAL LOW (ref 22–32)
Calcium: 9 mg/dL (ref 8.9–10.3)
Chloride: 112 mmol/L — ABNORMAL HIGH (ref 98–111)
Creatinine, Ser: 1.24 mg/dL — ABNORMAL HIGH (ref 0.44–1.00)
GFR calc Af Amer: 57 mL/min — ABNORMAL LOW (ref >60)
GFR calc non Af Amer: 49 mL/min — ABNORMAL LOW (ref >60)
Glucose, Bld: 118 mg/dL — ABNORMAL HIGH (ref 70–99)
Potassium: 3.5 mmol/L (ref 3.5–5.1)
Sodium: 141 mmol/L (ref 135–145)
Total Bilirubin: 0.6 mg/dL (ref 0.3–1.2)
Total Protein: 7.7 g/dL (ref 6.5–8.1)

## 2019-06-04 LAB — GROUP A STREP BY PCR: Group A Strep by PCR: NOT DETECTED

## 2019-06-04 MED ORDER — IBUPROFEN 800 MG PO TABS
800.0000 mg | ORAL_TABLET | Freq: Once | ORAL | Status: AC
  Administered 2019-06-04: 800 mg via ORAL
  Filled 2019-06-04: qty 1

## 2019-06-04 MED ORDER — DEXAMETHASONE SODIUM PHOSPHATE 10 MG/ML IJ SOLN
10.0000 mg | Freq: Once | INTRAMUSCULAR | Status: AC
  Administered 2019-06-04: 13:00:00 10 mg via INTRAVENOUS
  Filled 2019-06-04: qty 1

## 2019-06-04 MED ORDER — SODIUM CHLORIDE 0.9 % IV BOLUS
1000.0000 mL | Freq: Once | INTRAVENOUS | Status: AC
  Administered 2019-06-04: 12:00:00 1000 mL via INTRAVENOUS

## 2019-06-04 NOTE — Discharge Instructions (Signed)
You may rotate Tylenol and Motrin to help treat your fevers and sore throat.  You had a negative strep test today.  You were also tested for the coronavirus.  The result will be available in the next 3 to 5 days.  Until then you will need to self quarantine at home.  If your test is positive then you will need to continue to self quarantine for 14 days.  Please make an appointment with your regular doctor in the next 3 to 5 days for reevaluation.  If at any point your symptoms worsen or you develop new symptoms then you should return to the emergency department immediately for reevaluation.

## 2019-06-04 NOTE — ED Notes (Signed)
Bed: ZO10 Expected date:  Expected time:  Means of arrival:  Comments: EMS 53 yo body aches, COVID-y

## 2019-06-04 NOTE — ED Triage Notes (Signed)
Pt reports COVID like symptoms. Pt reports she recently attended a [place] with people from out of state.  Pt reports nausea, bodyaches, and generalized body aches.

## 2019-06-04 NOTE — ED Notes (Signed)
Pt d/c home per MD order. D/C summary reviewed, pt verbalizes understanding. Ambulatory.

## 2019-06-04 NOTE — ED Provider Notes (Signed)
[facility]-EMERGENCY DEPT Provider Note   CSN: [id number] Arrival date & time: 06/04/19  1126    History   Chief Complaint Chief Complaint  Patient presents with   Generalized Body Aches   Sore Throat   Fever    HPI Lauren BargeVickie C Newcombe is a 53 y.o. female.     HPI   Patient is a 53 year old female with a history of back pain, fibromyalgia, sarcoidosis, who presents the emergency department today for evaluation of a sore throat that began yesterday.  She also reports fevers, nausea, body aches, headache, and generalized fatigue.  Denies any cough.  Does state that she is been getting more winded with ambulation, she is not short of breath at rest.  She has no chest pain or pain with inspiration.  No lower extremity swelling.  No abdominal pain nausea vomiting diarrhea or urinary complaints.  No neurologic complaints.  States she was recently at a funeral where there were multiple people present from out of state.  She is unsure if she has been exposed to the coronavirus.  Past Medical History:  Diagnosis Date   Back pain    Fibromyalgia    Sarcoidosis     There are no active problems to display for this patient.   Past Surgical History:  Procedure Laterality Date   ABDOMINAL HYSTERECTOMY     APPENDECTOMY     CHOLECYSTECTOMY       OB History   No obstetric history on file.      Home Medications    Prior to Admission medications   Medication Sig Start Date End Date Taking? Authorizing Provider  acetaminophen (TYLENOL) 500 MG tablet Take by mouth.   Yes [provider]  atorvastatin (LIPITOR) 80 MG tablet Take 80 mg by mouth at bedtime.  02/28/19  Yes [provider]  gabapentin (NEURONTIN) 100 MG capsule Take 300 mg by mouth 2 (two) times a day. 04/18/19  Yes [provider]  Multiple Vitamins-Minerals (ONE DAILY MULTIVITAMIN WOMEN) TABS Take 1 tablet by mouth daily.   Yes [provider]    oxyCODONE-acetaminophen (PERCOCET) 5-325 MG tablet Take 1 tablet by mouth every 4 (four) hours as needed. 12/26/17  Yes Pollina, Canary Brimhristopher J, MD  oxyCODONE-acetaminophen (PERCOCET) 7.5-325 MG tablet Take 1 tablet by mouth every 4 (four) hours as needed for moderate pain.  05/13/19  Yes [provider]  rizatriptan (MAXALT) 10 MG tablet Take 10 mg by mouth as needed for migraine.  04/18/19  Yes [provider]  sertraline (ZOLOFT) 100 MG tablet Take 150 mg by mouth at bedtime. 09/11/15  Yes [provider]  topiramate (TOPAMAX) 50 MG tablet Take 50 mg by mouth 2 (two) times daily.  03/21/19  Yes [provider]    Family History No family history on file.  Social History Social History   Tobacco Use   Smoking status: Never Smoker   Smokeless tobacco: Never Used  Substance Use Topics   Alcohol use: Yes    Comment: ocassionally   Drug use: No     Allergies   Lyrica [pregabalin], Tomato, and Tramadol   Review of Systems Review of Systems  Constitutional: Positive for appetite change, chills, diaphoresis and fever.  HENT: Positive for sore throat. Negative for congestion and ear pain.   Eyes: Negative for visual disturbance.  Respiratory: Positive for shortness of breath (with exertion). Negative for cough.   Cardiovascular: Negative for chest pain.  Gastrointestinal: Negative for abdominal pain and  vomiting.  Genitourinary: Negative for dysuria and hematuria.  Musculoskeletal: Negative for back pain.  Skin: Negative for rash.  Neurological: Positive for weakness (generalized) and headaches. Negative for numbness.  All other systems reviewed and are negative.    Physical Exam Updated Vital Signs BP 115/86    Pulse 91    Temp 99.7 F (37.6 C) (Oral)    Resp 18    SpO2 96%   Physical Exam Vitals signs and nursing note reviewed.  Constitutional:      General: She is not in acute distress.    Appearance: She is well-developed.  HENT:      Head: Normocephalic and atraumatic.     Mouth/Throat:     Pharynx: Uvula midline. Oropharyngeal exudate and posterior oropharyngeal erythema present.     Tonsils: Tonsillar exudate present. No tonsillar abscesses. 2+ on the right. 2+ on the left.  Eyes:     Conjunctiva/sclera: Conjunctivae normal.  Neck:     Musculoskeletal: Neck supple.  Cardiovascular:     Rate and Rhythm: Normal rate and regular rhythm.     Heart sounds: Normal heart sounds. No murmur.  Pulmonary:     Effort: Pulmonary effort is normal. No respiratory distress.     Breath sounds: Normal breath sounds. No wheezing, rhonchi or rales.  Abdominal:     General: Bowel sounds are normal.     Palpations: Abdomen is soft.     Tenderness: There is no abdominal tenderness. There is no guarding or rebound.  Skin:    General: Skin is warm and dry.  Neurological:     General: No focal deficit present.     Mental Status: She is alert and oriented to person, place, and time.     GCS: GCS eye subscore is 4. GCS verbal subscore is 5. GCS motor subscore is 6.     Cranial Nerves: Cranial nerves are intact.      ED Treatments / Results  Labs (all labs ordered are listed, but only abnormal results are displayed) Labs Reviewed  COMPREHENSIVE METABOLIC PANEL - Abnormal; Notable for the following components:      Result Value   Chloride 112 (*)    CO2 18 (*)    Glucose, Bld 118 (*)    Creatinine, Ser 1.24 (*)    GFR calc non Af Amer 49 (*)    GFR calc Af Amer 57 (*)    All other components within normal limits  GROUP A STREP BY PCR  NOVEL CORONAVIRUS, NAA (HOSPITAL ORDER, SEND-OUT TO REF LAB)  CBC    EKG None  Radiology Dg Chest Portable 1 View  Result Date: 06/04/2019 CLINICAL DATA:  Generalized pain and nausea EXAM: PORTABLE CHEST 1 VIEW COMPARISON:  December 26, 2017 FINDINGS: There is no edema or consolidation. Heart is upper normal in size with pulmonary vascularity normal. No adenopathy. No bone lesions.  IMPRESSION: No edema or consolidation.  Stable cardiac silhouette. Electronically Signed   By: Lowella Grip III M.D.   On: 06/04/2019 13:57    Procedures Procedures (including critical care time)  Medications Ordered in ED Medications  ibuprofen (ADVIL) tablet 800 mg (800 mg Oral Given 06/04/19 1219)  sodium chloride 0.9 % bolus 1,000 mL (1,000 mLs Intravenous New Bag/Given 06/04/19 1223)  dexamethasone (DECADRON) injection 10 mg (10 mg Intravenous Given 06/04/19 1242)     Initial Impression / Assessment and Plan / ED Course  I have reviewed the triage vital signs and the nursing notes.  Pertinent  labs & imaging results that were available during my care of the patient were reviewed by me and considered in my medical decision making (see chart for details).  Final Clinical Impressions(s) / ED Diagnoses   Final diagnoses:  Viral syndrome   53 year old female presenting with sore throat fevers and generalized weakness for the last several days after attending a funeral with multiple people from out of state.  Unsure of covert exposure.  On arrival she is order line febrile and somewhat tachycardic.  She states she has not been eating and drinking for last several days.  She was given IV fluid hydration, antipyretics, on reassessment vital signs of improved.  She feels somewhat improved after being given Decadron for her sore throat.  Her strep testing is negative.  Labs are reassuring.  No leukocytosis.  No anemia.  CMP with low bicarb, suspect from hyperventilation as patient is breathing fast in her mask.  Slight elevation in serum creatinine, has history of similar elevation.  Suspect prerenal cause with decrease Choi.o. intake.  Chest x-ray does not show any evidence of edema or consolidation.  COVID testing is currently pending.  Patient is nontoxic and nonseptic appearing.  I feel that she is appropriate for outpatient treatment with antipyretics, hydration and close monitoring of symptoms.   Have advised her to self quarantine given high suspicion for coronavirus.  Have advised her to return to the ER for new or worsening symptoms in the meantime.  She voices understand the plan and reasons to return.  All questions answered.  Patient stable for discharge.  Lauren BargeVickie C Pyle was evaluated in Emergency Department on 06/04/2019 for the symptoms described in the history of present illness. She was evaluated in the context of the global COVID-19 pandemic, which necessitated consideration that the patient might be at risk for infection with the SARS-CoV-2 virus that causes COVID-19. Institutional protocols and algorithms that pertain to the evaluation of patients at risk for COVID-19 are in a state of rapid change based on information released by regulatory bodies including the CDC and federal and state organizations. These policies and algorithms were followed during the patient's care in the ED.   ED Discharge Orders    None       Rayne DuCouture, Shaleka Brines S, PA-C 06/04/19 1516    Gerhard MunchLockwood, Robert, MD 06/05/19 [phone]

## 2019-06-05 LAB — NOVEL CORONAVIRUS, NAA (HOSP ORDER, SEND-OUT TO REF LAB; TAT 18-24 HRS): SARS-CoV-2, NAA: NOT DETECTED

## 2021-03-11 IMAGING — DX PORTABLE CHEST - 1 VIEW
1 series · 1 of 1 positions shown · non-contrast
Comparison: December 26, 2017

CLINICAL DATA: Generalized pain and nausea

EXAM:
PORTABLE CHEST 1 VIEW

[chest ap]
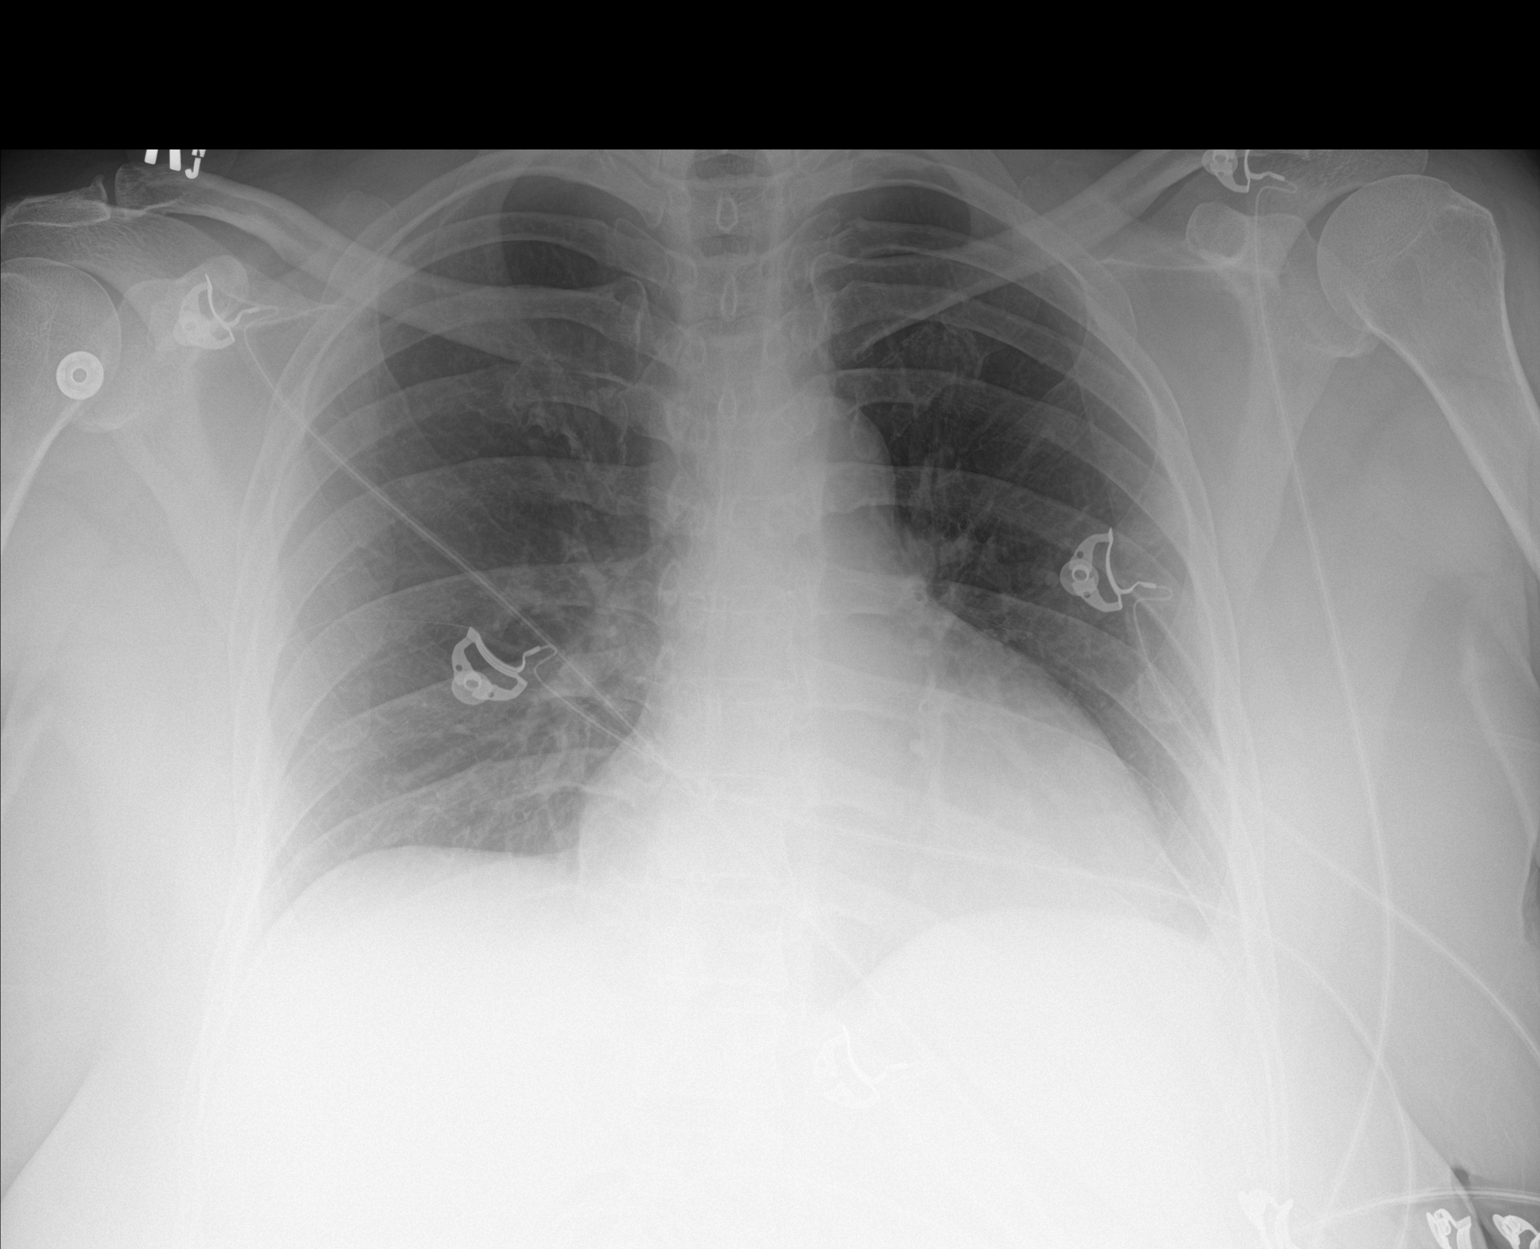

[1 of 1 positions shown; findings below may reference images not displayed]

FINDINGS: There is no edema or consolidation. Heart is upper normal in size
with pulmonary vascularity normal. No adenopathy. No bone lesions.
IMPRESSION: No edema or consolidation.  Stable cardiac silhouette.
